# Patient Record
Sex: Male | Born: 1965 | Hispanic: Yes | Marital: Single | Smoking: Current some day smoker
Health system: Southern US, Community
[De-identification: ages and names within clinical notes are randomized; demographics above are authoritative.]

## PROBLEM LIST (undated history)

## (undated) DIAGNOSIS — K219 Gastro-esophageal reflux disease without esophagitis: Secondary | ICD-10-CM

## (undated) DIAGNOSIS — K2970 Gastritis, unspecified, without bleeding: Secondary | ICD-10-CM

## (undated) DIAGNOSIS — K5720 Diverticulitis of large intestine with perforation and abscess without bleeding: Secondary | ICD-10-CM

## (undated) DIAGNOSIS — E785 Hyperlipidemia, unspecified: Secondary | ICD-10-CM

## (undated) DIAGNOSIS — E119 Type 2 diabetes mellitus without complications: Secondary | ICD-10-CM

## (undated) DIAGNOSIS — D649 Anemia, unspecified: Secondary | ICD-10-CM

## (undated) HISTORY — PX: COLOSTOMY: SHX63

## (undated) HISTORY — DX: Hyperlipidemia, unspecified: E78.5

## (undated) HISTORY — DX: Type 2 diabetes mellitus without complications: E11.9

## (undated) HISTORY — PX: COLON SURGERY: SHX602

## (undated) HISTORY — DX: Diverticulitis of large intestine with perforation and abscess without bleeding: K57.20

---

## 2013-04-06 ENCOUNTER — Emergency Department: Payer: Self-pay | Admitting: Emergency Medicine

## 2013-04-06 LAB — BASIC METABOLIC PANEL
Anion Gap: 11 (ref 7–16)
BUN: 11 mg/dL (ref 7–18)
Calcium, Total: 8.5 mg/dL (ref 8.5–10.1)
Co2: 23 mmol/L (ref 21–32)
EGFR (African American): >60
EGFR (Non-African Amer.): >60
Osmolality: 277 (ref 275–301)
Potassium: 3.1 mmol/L — ABNORMAL LOW (ref 3.5–5.1)
Sodium: 136 mmol/L (ref 136–145)

## 2013-04-06 LAB — DRUG SCREEN, URINE
Amphetamines, Ur Screen: NEGATIVE (ref ?–1000)
Cannabinoid 50 Ng, Ur ~~LOC~~: NEGATIVE (ref ?–50)
Cocaine Metabolite,Ur ~~LOC~~: NEGATIVE (ref ?–300)
MDMA (Ecstasy)Ur Screen: NEGATIVE (ref ?–500)
Opiate, Ur Screen: NEGATIVE (ref ?–300)
Phencyclidine (PCP) Ur S: NEGATIVE (ref ?–25)
Tricyclic, Ur Screen: NEGATIVE (ref ?–1000)

## 2013-04-06 LAB — CBC
MCV: 80 fL (ref 80–100)
Platelet: 357 10*3/uL (ref 150–440)
RBC: 4.83 10*6/uL (ref 4.40–5.90)
RDW: 16.1 % — ABNORMAL HIGH (ref 11.5–14.5)
WBC: 8.4 10*3/uL (ref 3.8–10.6)

## 2013-04-07 LAB — TROPONIN I: Troponin-I: <0.02 ng/mL

## 2013-04-16 ENCOUNTER — Emergency Department: Payer: Self-pay | Admitting: Emergency Medicine

## 2013-04-16 LAB — CBC
HCT: 37.9 % — ABNORMAL LOW (ref 40.0–52.0)
HGB: 12.4 g/dL — ABNORMAL LOW (ref 13.0–18.0)
MCH: 26.6 pg (ref 26.0–34.0)
Platelet: 267 10*3/uL (ref 150–440)
RBC: 4.66 10*6/uL (ref 4.40–5.90)
RDW: 16.6 % — ABNORMAL HIGH (ref 11.5–14.5)

## 2013-04-16 LAB — COMPREHENSIVE METABOLIC PANEL
Albumin: 3.7 g/dL (ref 3.4–5.0)
Alkaline Phosphatase: 78 U/L (ref 50–136)
Anion Gap: 8 (ref 7–16)
Bilirubin,Total: 0.2 mg/dL (ref 0.2–1.0)
Calcium, Total: 8.2 mg/dL — ABNORMAL LOW (ref 8.5–10.1)
Chloride: 109 mmol/L — ABNORMAL HIGH (ref 98–107)
EGFR (Non-African Amer.): >60
Glucose: 137 mg/dL — ABNORMAL HIGH (ref 65–99)
Osmolality: 282 (ref 275–301)
Potassium: 3.5 mmol/L (ref 3.5–5.1)
SGOT(AST): 27 U/L (ref 15–37)
SGPT (ALT): 29 U/L (ref 12–78)
Sodium: 140 mmol/L (ref 136–145)

## 2013-04-16 LAB — TROPONIN I
Troponin-I: <0.02 ng/mL
Troponin-I: <0.02 ng/mL

## 2013-04-16 LAB — CK TOTAL AND CKMB (NOT AT ARMC): CK-MB: 1.8 ng/mL (ref 0.5–3.6)

## 2017-02-13 ENCOUNTER — Ambulatory Visit
Admission: RE | Admit: 2017-02-13 | Discharge: 2017-02-13 | Disposition: A | Discharge status: Home / Self Care | Payer: Self-pay | Source: Ambulatory Visit | Attending: Obstetrics and Gynecology | Admitting: Obstetrics and Gynecology

## 2017-02-13 ENCOUNTER — Other Ambulatory Visit: Payer: Self-pay | Admitting: Obstetrics and Gynecology

## 2017-02-13 DIAGNOSIS — J189 Pneumonia, unspecified organism: Secondary | ICD-10-CM | POA: Insufficient documentation

## 2017-02-13 DIAGNOSIS — F1010 Alcohol abuse, uncomplicated: Secondary | ICD-10-CM | POA: Insufficient documentation

## 2017-02-13 DIAGNOSIS — I517 Cardiomegaly: Secondary | ICD-10-CM | POA: Insufficient documentation

## 2017-02-13 DIAGNOSIS — E119 Type 2 diabetes mellitus without complications: Secondary | ICD-10-CM | POA: Insufficient documentation

## 2017-02-13 DIAGNOSIS — D649 Anemia, unspecified: Secondary | ICD-10-CM | POA: Insufficient documentation

## 2018-06-24 ENCOUNTER — Emergency Department: Payer: Self-pay | Admitting: Anesthesiology

## 2018-06-24 ENCOUNTER — Encounter: Payer: Self-pay | Admitting: Emergency Medicine

## 2018-06-24 ENCOUNTER — Emergency Department: Payer: Self-pay

## 2018-06-24 ENCOUNTER — Other Ambulatory Visit: Payer: Self-pay

## 2018-06-24 ENCOUNTER — Inpatient Hospital Stay
Admission: EM | Admit: 2018-06-24 | Discharge: 2018-06-30 | DRG: 330 | Disposition: A | Discharge status: Home / Self Care | Payer: Self-pay | Attending: Surgery | Admitting: Surgery

## 2018-06-24 ENCOUNTER — Encounter
Admission: EM | Disposition: A | Discharge status: Home / Self Care | Payer: Self-pay | Source: Home / Self Care | Attending: Surgery

## 2018-06-24 DIAGNOSIS — E785 Hyperlipidemia, unspecified: Secondary | ICD-10-CM | POA: Diagnosis present

## 2018-06-24 DIAGNOSIS — Z87891 Personal history of nicotine dependence: Secondary | ICD-10-CM

## 2018-06-24 DIAGNOSIS — N179 Acute kidney failure, unspecified: Secondary | ICD-10-CM | POA: Diagnosis present

## 2018-06-24 DIAGNOSIS — K6389 Other specified diseases of intestine: Secondary | ICD-10-CM

## 2018-06-24 DIAGNOSIS — K668 Other specified disorders of peritoneum: Secondary | ICD-10-CM

## 2018-06-24 DIAGNOSIS — E860 Dehydration: Secondary | ICD-10-CM | POA: Diagnosis present

## 2018-06-24 DIAGNOSIS — K631 Perforation of intestine (nontraumatic): Principal | ICD-10-CM | POA: Diagnosis present

## 2018-06-24 DIAGNOSIS — Z884 Allergy status to anesthetic agent status: Secondary | ICD-10-CM

## 2018-06-24 DIAGNOSIS — K56609 Unspecified intestinal obstruction, unspecified as to partial versus complete obstruction: Secondary | ICD-10-CM | POA: Diagnosis present

## 2018-06-24 DIAGNOSIS — Z8711 Personal history of peptic ulcer disease: Secondary | ICD-10-CM

## 2018-06-24 DIAGNOSIS — Z23 Encounter for immunization: Secondary | ICD-10-CM

## 2018-06-24 HISTORY — PX: LAPAROTOMY: SHX154

## 2018-06-24 LAB — CBC
HCT: 44.3 % (ref 39.0–52.0)
HEMOGLOBIN: 14.6 g/dL (ref 13.0–17.0)
MCH: 28.1 pg (ref 26.0–34.0)
MCHC: 33 g/dL (ref 30.0–36.0)
MCV: 85.4 fL (ref 80.0–100.0)
PLATELETS: 417 10*3/uL — AB (ref 150–400)
RBC: 5.19 MIL/uL (ref 4.22–5.81)
RDW: 14 % (ref 11.5–15.5)
WBC: 8.8 10*3/uL (ref 4.0–10.5)
nRBC: 0 % (ref 0.0–0.2)

## 2018-06-24 LAB — COMPREHENSIVE METABOLIC PANEL
ALBUMIN: 3.9 g/dL (ref 3.5–5.0)
ALK PHOS: 79 U/L (ref 38–126)
ALT: 44 U/L (ref 0–44)
AST: 29 U/L (ref 15–41)
Anion gap: 12 (ref 5–15)
BILIRUBIN TOTAL: 2.1 mg/dL — AB (ref 0.3–1.2)
BUN: 27 mg/dL — AB (ref 6–20)
CALCIUM: 8.8 mg/dL — AB (ref 8.9–10.3)
CO2: 27 mmol/L (ref 22–32)
CREATININE: 1.61 mg/dL — AB (ref 0.61–1.24)
Chloride: 93 mmol/L — ABNORMAL LOW (ref 98–111)
GFR calc Af Amer: 55 mL/min — ABNORMAL LOW (ref >60)
GFR calc non Af Amer: 48 mL/min — ABNORMAL LOW (ref >60)
Glucose, Bld: 339 mg/dL — ABNORMAL HIGH (ref 70–99)
POTASSIUM: 4 mmol/L (ref 3.5–5.1)
Sodium: 132 mmol/L — ABNORMAL LOW (ref 135–145)
TOTAL PROTEIN: 7.4 g/dL (ref 6.5–8.1)

## 2018-06-24 LAB — LIPASE, BLOOD: Lipase: 27 U/L (ref 11–51)

## 2018-06-24 SURGERY — LAPAROTOMY, EXPLORATORY
Anesthesia: General

## 2018-06-24 MED ORDER — FENTANYL CITRATE (PF) 100 MCG/2ML IJ SOLN
INTRAMUSCULAR | Status: AC
  Administered 2018-06-24: 25 ug via INTRAVENOUS
  Filled 2018-06-24: qty 2

## 2018-06-24 MED ORDER — SODIUM CHLORIDE 0.9 % IV BOLUS
1000.0000 mL | Freq: Once | INTRAVENOUS | Status: AC
  Administered 2018-06-24: 1000 mL via INTRAVENOUS

## 2018-06-24 MED ORDER — SUCCINYLCHOLINE CHLORIDE 20 MG/ML IJ SOLN
INTRAMUSCULAR | Status: DC | PRN
  Administered 2018-06-24: 120 mg via INTRAVENOUS

## 2018-06-24 MED ORDER — BUPIVACAINE-EPINEPHRINE (PF) 0.5% -1:200000 IJ SOLN
INTRAMUSCULAR | Status: DC | PRN
  Administered 2018-06-24: 30 mL via PERINEURAL

## 2018-06-24 MED ORDER — PANTOPRAZOLE SODIUM 40 MG IV SOLR
40.0000 mg | Freq: Every day | INTRAVENOUS | Status: DC
  Administered 2018-06-25 – 2018-06-27 (×4): 40 mg via INTRAVENOUS
  Filled 2018-06-24 (×4): qty 40

## 2018-06-24 MED ORDER — PROPOFOL 10 MG/ML IV BOLUS
INTRAVENOUS | Status: DC | PRN
  Administered 2018-06-24: 150 mg via INTRAVENOUS

## 2018-06-24 MED ORDER — LORAZEPAM 2 MG/ML IJ SOLN: 0.0000 mg | Freq: Two times a day (BID) | INTRAMUSCULAR | Status: AC

## 2018-06-24 MED ORDER — SUGAMMADEX SODIUM 200 MG/2ML IV SOLN
INTRAVENOUS | Status: DC | PRN
  Administered 2018-06-24: 150 mg via INTRAVENOUS

## 2018-06-24 MED ORDER — VITAMIN B-1 100 MG PO TABS
100.0000 mg | ORAL_TABLET | Freq: Every day | ORAL | Status: DC
  Administered 2018-06-28 – 2018-06-30 (×3): 100 mg via ORAL
  Filled 2018-06-24 (×3): qty 1

## 2018-06-24 MED ORDER — BUPIVACAINE LIPOSOME 1.3 % IJ SUSP
INTRAMUSCULAR | Status: DC | PRN
  Administered 2018-06-24: 20 mL

## 2018-06-24 MED ORDER — FENTANYL CITRATE (PF) 100 MCG/2ML IJ SOLN
INTRAMUSCULAR | Status: DC | PRN
  Administered 2018-06-24 (×5): 50 ug via INTRAVENOUS

## 2018-06-24 MED ORDER — KETAMINE HCL 50 MG/ML IJ SOLN
INTRAMUSCULAR | Status: DC | PRN
  Administered 2018-06-24: 50 mg via INTRAMUSCULAR

## 2018-06-24 MED ORDER — MIDAZOLAM HCL 2 MG/2ML IJ SOLN
INTRAMUSCULAR | Status: DC | PRN
  Administered 2018-06-24: 2 mg via INTRAVENOUS

## 2018-06-24 MED ORDER — DEXAMETHASONE SODIUM PHOSPHATE 10 MG/ML IJ SOLN
INTRAMUSCULAR | Status: DC | PRN
  Administered 2018-06-24: 10 mg via INTRAVENOUS

## 2018-06-24 MED ORDER — HEPARIN SODIUM (PORCINE) 5000 UNIT/ML IJ SOLN
5000.0000 [IU] | Freq: Three times a day (TID) | INTRAMUSCULAR | Status: DC
  Administered 2018-06-25 – 2018-06-30 (×15): 5000 [IU] via SUBCUTANEOUS
  Filled 2018-06-24 (×15): qty 1

## 2018-06-24 MED ORDER — HYDRALAZINE HCL 20 MG/ML IJ SOLN: 10.0000 mg | INTRAMUSCULAR | Status: DC | PRN

## 2018-06-24 MED ORDER — LORAZEPAM 1 MG PO TABS: 1.0000 mg | ORAL_TABLET | Freq: Four times a day (QID) | ORAL | Status: AC | PRN

## 2018-06-24 MED ORDER — KETAMINE HCL 50 MG/ML IJ SOLN
INTRAMUSCULAR | Status: AC
  Filled 2018-06-24: qty 10

## 2018-06-24 MED ORDER — HYDROMORPHONE HCL 1 MG/ML IJ SOLN
1.0000 mg | Freq: Once | INTRAMUSCULAR | Status: AC
  Administered 2018-06-24: 1 mg via INTRAVENOUS
  Filled 2018-06-24: qty 1

## 2018-06-24 MED ORDER — IOHEXOL 300 MG/ML  SOLN
75.0000 mL | Freq: Once | INTRAMUSCULAR | Status: AC | PRN
  Administered 2018-06-24: 75 mL via INTRAVENOUS

## 2018-06-24 MED ORDER — ACETAMINOPHEN 10 MG/ML IV SOLN
INTRAVENOUS | Status: DC | PRN
  Administered 2018-06-24: 1000 mg via INTRAVENOUS

## 2018-06-24 MED ORDER — FENTANYL CITRATE (PF) 250 MCG/5ML IJ SOLN
INTRAMUSCULAR | Status: AC
  Filled 2018-06-24: qty 5

## 2018-06-24 MED ORDER — ROCURONIUM BROMIDE 100 MG/10ML IV SOLN
INTRAVENOUS | Status: DC | PRN
  Administered 2018-06-24: 20 mg via INTRAVENOUS
  Administered 2018-06-24: 30 mg via INTRAVENOUS
  Administered 2018-06-24: 40 mg via INTRAVENOUS
  Administered 2018-06-24: 10 mg via INTRAVENOUS

## 2018-06-24 MED ORDER — ONDANSETRON HCL 4 MG/2ML IJ SOLN: 4.0000 mg | Freq: Once | INTRAMUSCULAR | Status: DC | PRN

## 2018-06-24 MED ORDER — PROPOFOL 10 MG/ML IV BOLUS
INTRAVENOUS | Status: AC
  Filled 2018-06-24: qty 20

## 2018-06-24 MED ORDER — LACTATED RINGERS IV SOLN
125.0000 mL/h | INTRAVENOUS | Status: DC
  Administered 2018-06-24 (×2): via INTRAVENOUS
  Administered 2018-06-25 – 2018-06-28 (×6): 125 mL/h via INTRAVENOUS

## 2018-06-24 MED ORDER — SODIUM CHLORIDE 0.9 % IV SOLN
1.0000 mg | Freq: Every day | INTRAVENOUS | Status: DC
  Administered 2018-06-25 – 2018-06-27 (×3): 1 mg via INTRAVENOUS
  Filled 2018-06-24 (×5): qty 0.2

## 2018-06-24 MED ORDER — KETOROLAC TROMETHAMINE 15 MG/ML IJ SOLN
15.0000 mg | Freq: Four times a day (QID) | INTRAMUSCULAR | Status: DC
  Administered 2018-06-25 – 2018-06-28 (×12): 15 mg via INTRAVENOUS
  Filled 2018-06-24 (×13): qty 1

## 2018-06-24 MED ORDER — PIPERACILLIN-TAZOBACTAM 3.375 G IVPB 30 MIN
3.3750 g | Freq: Once | INTRAVENOUS | Status: AC
  Administered 2018-06-24: 3.375 g via INTRAVENOUS
  Filled 2018-06-24: qty 50

## 2018-06-24 MED ORDER — MIDAZOLAM HCL 2 MG/2ML IJ SOLN
INTRAMUSCULAR | Status: AC
  Filled 2018-06-24: qty 2

## 2018-06-24 MED ORDER — ACETAMINOPHEN 10 MG/ML IV SOLN
INTRAVENOUS | Status: AC
  Filled 2018-06-24: qty 100

## 2018-06-24 MED ORDER — LORAZEPAM 2 MG/ML IJ SOLN: 0.0000 mg | Freq: Four times a day (QID) | INTRAMUSCULAR | Status: AC

## 2018-06-24 MED ORDER — ONDANSETRON HCL 4 MG/2ML IJ SOLN
4.0000 mg | Freq: Four times a day (QID) | INTRAMUSCULAR | Status: DC | PRN
  Administered 2018-06-26: 4 mg via INTRAVENOUS
  Filled 2018-06-24: qty 2

## 2018-06-24 MED ORDER — ONDANSETRON HCL 4 MG/2ML IJ SOLN
4.0000 mg | Freq: Once | INTRAMUSCULAR | Status: AC
  Administered 2018-06-24: 4 mg via INTRAVENOUS
  Filled 2018-06-24: qty 2

## 2018-06-24 MED ORDER — PHENYLEPHRINE HCL 10 MG/ML IJ SOLN
INTRAMUSCULAR | Status: DC | PRN
  Administered 2018-06-24 (×3): 200 ug via INTRAVENOUS
  Administered 2018-06-24 (×2): 100 ug via INTRAVENOUS

## 2018-06-24 MED ORDER — INFLUENZA VAC SPLIT QUAD 0.5 ML IM SUSY
0.5000 mL | PREFILLED_SYRINGE | INTRAMUSCULAR | Status: AC
  Administered 2018-06-28: 0.5 mL via INTRAMUSCULAR
  Filled 2018-06-24: qty 0.5

## 2018-06-24 MED ORDER — THIAMINE HCL 100 MG/ML IJ SOLN
100.0000 mg | Freq: Every day | INTRAMUSCULAR | Status: DC
  Administered 2018-06-25 – 2018-06-27 (×3): 100 mg via INTRAVENOUS
  Filled 2018-06-24 (×3): qty 2

## 2018-06-24 MED ORDER — HYDROMORPHONE HCL 1 MG/ML IJ SOLN
0.5000 mg | INTRAMUSCULAR | Status: DC | PRN
  Administered 2018-06-26 (×2): 0.5 mg via INTRAVENOUS
  Filled 2018-06-24 (×2): qty 0.5

## 2018-06-24 MED ORDER — ONDANSETRON 4 MG PO TBDP: 4.0000 mg | ORAL_TABLET | Freq: Four times a day (QID) | ORAL | Status: DC | PRN

## 2018-06-24 MED ORDER — LIDOCAINE HCL (CARDIAC) PF 100 MG/5ML IV SOSY
PREFILLED_SYRINGE | INTRAVENOUS | Status: DC | PRN
  Administered 2018-06-24: 100 mg via INTRAVENOUS

## 2018-06-24 MED ORDER — FENTANYL CITRATE (PF) 100 MCG/2ML IJ SOLN
25.0000 ug | INTRAMUSCULAR | Status: DC | PRN
  Administered 2018-06-24 (×4): 25 ug via INTRAVENOUS

## 2018-06-24 MED ORDER — PIPERACILLIN-TAZOBACTAM 3.375 G IVPB
3.3750 g | Freq: Three times a day (TID) | INTRAVENOUS | Status: DC
  Administered 2018-06-25 – 2018-06-30 (×16): 3.375 g via INTRAVENOUS
  Filled 2018-06-24 (×16): qty 50

## 2018-06-24 MED ORDER — LORAZEPAM 2 MG/ML IJ SOLN: 1.0000 mg | Freq: Four times a day (QID) | INTRAMUSCULAR | Status: AC | PRN

## 2018-06-24 MED ORDER — LACTATED RINGERS IV SOLN
INTRAVENOUS | Status: DC | PRN
  Administered 2018-06-24 (×2): via INTRAVENOUS

## 2018-06-24 MED ORDER — ONDANSETRON HCL 4 MG/2ML IJ SOLN
INTRAMUSCULAR | Status: DC | PRN
  Administered 2018-06-24: 4 mg via INTRAVENOUS

## 2018-06-24 SURGICAL SUPPLY — 51 items
BULB RESERV EVAC DRAIN JP 100C (MISCELLANEOUS) ×9 IMPLANT
CANISTER SUCT 1200ML W/VALVE (MISCELLANEOUS) ×3 IMPLANT
CATH URET ROBINSON 16FR STRL (CATHETERS) ×3 IMPLANT
CHLORAPREP W/TINT 10.5 ML (MISCELLANEOUS) ×3 IMPLANT
COVER WAND RF STERILE (DRAPES) ×3 IMPLANT
DECANTER SPIKE VIAL GLASS SM (MISCELLANEOUS) ×3 IMPLANT
DRAIN CHANNEL JP 19F (MISCELLANEOUS) ×6 IMPLANT
DRAIN PENROSE 1/4X12 LTX (DRAIN) ×3 IMPLANT
DRAPE LAPAROTOMY 100X77 ABD (DRAPES) ×3 IMPLANT
DRSG OPSITE POSTOP 4X12 (GAUZE/BANDAGES/DRESSINGS) ×3 IMPLANT
DRSG TEGADERM 4X10 (GAUZE/BANDAGES/DRESSINGS) ×3 IMPLANT
DRSG TEGADERM 4X4.75 (GAUZE/BANDAGES/DRESSINGS) ×6 IMPLANT
ELECT CAUTERY BLADE 6.4 (BLADE) ×3 IMPLANT
ELECT REM PT RETURN 9FT ADLT (ELECTROSURGICAL) ×3
ELECTRODE REM PT RTRN 9FT ADLT (ELECTROSURGICAL) ×1 IMPLANT
GAUZE SPONGE 4X4 12PLY STRL (GAUZE/BANDAGES/DRESSINGS) ×3 IMPLANT
GLOVE SURG SYN 7.0 (GLOVE) ×6 IMPLANT
GLOVE SURG SYN 7.5  E (GLOVE) ×4
GLOVE SURG SYN 7.5 E (GLOVE) ×2 IMPLANT
GOWN STRL REUS W/ TWL LRG LVL3 (GOWN DISPOSABLE) ×4 IMPLANT
GOWN STRL REUS W/TWL LRG LVL3 (GOWN DISPOSABLE) ×8
KIT OSTOMY 2 PC DRNBL 2.25 STR (WOUND CARE) ×1 IMPLANT
KIT OSTOMY DRAINABLE 2.25 STR (WOUND CARE) ×2
KIT OSTOMY DRAINABLE 2.75 STR (WOUND CARE) ×3 IMPLANT
LABEL OR SOLS (LABEL) ×3 IMPLANT
LIGASURE IMPACT 36 18CM CVD LR (INSTRUMENTS) ×3 IMPLANT
NEEDLE HYPO 22GX1.5 SAFETY (NEEDLE) ×3 IMPLANT
NS IRRIG 1000ML POUR BTL (IV SOLUTION) ×3 IMPLANT
PACK BASIN MAJOR ARMC (MISCELLANEOUS) ×3 IMPLANT
PACK COLON CLEAN CLOSURE (MISCELLANEOUS) IMPLANT
RELOAD PROXIMATE 75MM BLUE (ENDOMECHANICALS) ×3 IMPLANT
SEPRAFILM MEMBRANE 5X6 (MISCELLANEOUS) IMPLANT
SPONGE LAP 18X18 RF (DISPOSABLE) ×3 IMPLANT
STAPLER CUT CVD 40MM GREEN (STAPLE) ×3 IMPLANT
STAPLER PROXIMATE 75MM BLUE (STAPLE) ×3 IMPLANT
STAPLER SKIN PROX 35W (STAPLE) ×3 IMPLANT
SUT ETHILON 3-0 FS-10 30 BLK (SUTURE) ×9
SUT PDS AB 1 CT1 36 (SUTURE) ×6 IMPLANT
SUT PDS AB 1 TP1 96 (SUTURE) ×6 IMPLANT
SUT PROLENE 0 CT 1 30 (SUTURE) IMPLANT
SUT PROLENE 2 0 SH DA (SUTURE) ×3 IMPLANT
SUT SILK 2 0 (SUTURE) ×2
SUT SILK 2-0 18XBRD TIE 12 (SUTURE) ×1 IMPLANT
SUT SILK 3-0 (SUTURE) ×3 IMPLANT
SUT VIC AB 3-0 SH 27 (SUTURE) ×2
SUT VIC AB 3-0 SH 27X BRD (SUTURE) ×1 IMPLANT
SUT VICRYL 3-0 CR8 SH (SUTURE) ×3 IMPLANT
SUT VICRYL 3-0 SH-1 18IN (SUTURE) ×3 IMPLANT
SUTURE EHLN 3-0 FS-10 30 BLK (SUTURE) ×3 IMPLANT
SYR 10ML LL (SYRINGE) ×3 IMPLANT
TRAY FOLEY MTR SLVR 16FR STAT (SET/KITS/TRAYS/PACK) ×3 IMPLANT

## 2018-06-24 NOTE — ED Provider Notes (Signed)
[facility] Emergency Department Provider Note  ____________________________________________  Time seen: Approximately 3:51 PM  I have reviewed the triage vital signs and the nursing notes.   HISTORY  Chief Complaint Abdominal pain  Encounter completed with video interpreter at bedside.  HPI Alexander Carpenter is a 52 y.o. male with past medical history of peptic ulcer disease who complains of gradual onset of worsening abdominal pain over the past 8 days, constant.  Generalized.  Nonradiating.  Worse with any movement.  No alleviating factors.  Associated with vomiting and diarrhea.  He reports that his stool has been black and that he had been seeing blood in it but for the last 2 days he thinks there is not been blood.  He reports that he used to drink 6-8 alcoholic beverages a day, beer or tequila, but has not had anything to drink in 8 days due to his symptoms.  No aspirin or blood thinner use.  Last ate yesterday afternoon, consisting of a banana   Past medical history Peptic ulcer disease  There are no active problems to display for this patient.    History reviewed. No pertinent surgical history.   Prior to Admission medications   Not on File  None   Allergies Anesthesia s-i-40 [propofol]   No family history on file.  Social History Social History   Tobacco Use  . Smoking status: Not on file  Substance Use Topics  . Alcohol use: Not on file  . Drug use: Not on file  No tobacco use.  Formerly heavy daily alcohol, none in the last 8 days  Review of Systems  Constitutional:   No fever or chills.  ENT:   No sore throat. No rhinorrhea. Cardiovascular:   No chest pain or syncope. Respiratory:   No dyspnea or cough. Gastrointestinal: Positive as above for abdominal pain vomiting and diarrhea Musculoskeletal:   Negative for focal pain or swelling All other systems reviewed and are negative except as documented above in ROS and  HPI.  ____________________________________________   PHYSICAL EXAM:  VITAL SIGNS: ED Triage Vitals  Enc Vitals Group     BP 06/24/18 1101 (!) 118/93     Pulse Rate 06/24/18 1101 (!) 113     Resp 06/24/18 1101 20     Temp 06/24/18 1101 98.2 F (36.8 C)     Temp Source 06/24/18 1101 Oral     SpO2 06/24/18 1101 96 %     Weight 06/24/18 1102 164 lb (74.4 kg)     Height 06/24/18 1102 5\' 4"  (1.626 m)     Head Circumference --      Peak Flow --      Pain Score 06/24/18 1102 10     Pain Loc --      Pain Edu? --      Excl. in GC? --     Vital signs reviewed, nursing assessments reviewed.   Constitutional:   Alert and oriented. Non-toxic appearance. Eyes:   Conjunctivae are normal. EOMI. PERRL. ENT      Head:   Normocephalic and atraumatic.      Nose:   No congestion/rhinnorhea.       Mouth/Throat:   Dry mucous membranes, no pharyngeal erythema. No peritonsillar mass.       Neck:   No meningismus. Full ROM. Hematological/Lymphatic/Immunilogical:   No cervical lymphadenopathy. Cardiovascular:   RRR. Symmetric bilateral radial and DP pulses.  No murmurs. Cap refill less than 2 seconds. Respiratory:   Normal  respiratory effort without tachypnea/retractions. Breath sounds are clear and equal bilaterally. No wheezes/rales/rhonchi. Gastrointestinal:   Abdomen is distended and tympanitic.  Diffusely tender with guarding and rebound tenderness.  Nonrigid.  There is a small umbilical hernia, not incarcerated.  Rectal exam reveals brown stool, Hemoccult positive  Musculoskeletal:   Normal range of motion in all extremities. No joint effusions.  No lower extremity tenderness.  No edema. Neurologic:   Normal speech and language.  Motor grossly intact. No acute focal neurologic deficits are appreciated.  Skin:    Skin is warm, dry and intact. No rash noted.  No petechiae, purpura, or bullae.  ____________________________________________    LABS (pertinent positives/negatives) (all labs  ordered are listed, but only abnormal results are displayed) Labs Reviewed  COMPREHENSIVE METABOLIC PANEL - Abnormal; Notable for the following components:      Result Value   Sodium 132 (*)    Chloride 93 (*)    Glucose, Bld 339 (*)    BUN 27 (*)    Creatinine, Ser 1.61 (*)    Calcium 8.8 (*)    Total Bilirubin 2.1 (*)    GFR calc non Af Amer 48 (*)    GFR calc Af Amer 55 (*)    All other components within normal limits  CBC - Abnormal; Notable for the following components:   Platelets 417 (*)    All other components within normal limits  LIPASE, BLOOD  URINALYSIS, COMPLETE (UACMP) WITH MICROSCOPIC   ____________________________________________   EKG    ____________________________________________    RADIOLOGY  Ct Abdomen Pelvis W Contrast  Result Date: 06/24/2018 CLINICAL DATA:  Generalized abdominal pain. Diarrhea and vomiting. Personal history of stomach ulcer. EXAM: CT ABDOMEN AND PELVIS WITH CONTRAST TECHNIQUE: Multidetector CT imaging of the abdomen and pelvis was performed using the standard protocol following bolus administration of intravenous contrast. CONTRAST:  75mL OMNIPAQUE IOHEXOL 300 MG/ML  SOLN COMPARISON:  None. FINDINGS: Lower chest: The lung bases demonstrate mild dependent atelectasis, right greater than left. Heart size is upper limits of normal. No significant pericardial effusion is present. Hepatobiliary: No focal liver abnormality is seen. No gallstones, gallbladder wall thickening, or biliary dilatation. There is some fluid about the free edge of the liver. Pancreas: Unremarkable. No pancreatic ductal dilatation or surrounding inflammatory changes. Spleen: Mild fluid is present about the spleen no discrete splenic lesions are present. Adrenals/Urinary Tract: The adrenal glands are normal bilaterally. Kidneys and ureters are within normal limits. The urinary bladder is unremarkable. Stomach/Bowel: Stomach duodenum are within normal limits. There is gas  throughout the small and large bowel mesentery. Small bowel is otherwise unremarkable. The cecum is dilated. Extensive pneumatosis is present in the cecum. Hepatic flexure and transverse colon are within normal limits. Proximal descending colon is unremarkable. There is marked wall thickening and narrowing of the proximal sigmoid, concerning for neoplasm. Vascular/Lymphatic: No significant adenopathy is present. No vascular lesions are present. Reproductive: Prostate is unremarkable. Other: Scattered free fluid is present. There is also gas along the sigmoid colon. No abscess formation is present. Musculoskeletal: Vertebral body heights alignment are maintained. Moderate degenerative changes and disc disease is present at L4-5. There is central canal and foraminal narrowing at L4-5 greater than L3-4. Bony pelvis is within normal limits. No focal lytic or blastic lesions are present. IMPRESSION: 1. Pneumoperitoneum. 2. 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 3. Pneumatosis in the cecum. This may reflect  the sequelae of obstruction secondary to the sigmoid mass. 4. Free fluid is reactive. 5. No evidence for metastatic disease to lymph nodes or liver. Critical Value/emergent results were called by telephone at the time of interpretation on 06/24/2018 at 4:30 pm to Dr. Sharman Cheek , who verbally acknowledged these results. Electronically Signed   By: Marin Roberts M.D.   On: 06/24/2018 16:33    ____________________________________________   PROCEDURES .Critical Care Performed by: Sharman Cheek, MD Authorized by: Sharman Cheek, MD   Critical care provider statement:    Critical care time (minutes):  30   Critical care time was exclusive of:  Separately billable procedures and treating other patients   Critical care was necessary to treat or prevent imminent or  life-threatening deterioration of the following conditions:  Shock and sepsis   Critical care was time spent personally by me on the following activities:  Development of treatment plan with patient or surrogate, discussions with consultants, evaluation of patient's response to treatment, examination of patient, obtaining history from patient or surrogate, ordering and performing treatments and interventions, ordering and review of laboratory studies, ordering and review of radiographic studies, pulse oximetry, re-evaluation of patient's condition and review of old charts    ____________________________________________  DIFFERENTIAL DIAGNOSIS   Bowel perforation, bowel obstruction, cirrhosis with ascites and SBP, biliary disease, GI bleed  CLINICAL IMPRESSION / ASSESSMENT AND PLAN / ED COURSE  Pertinent labs & imaging results that were available during my care of the patient were reviewed by me and considered in my medical decision making (see chart for details).    Patient is nontoxic, mildly tachycardic and dehydrated, presents with worsening abdominal pain and concerning exam.  I will obtain a CT scan to evaluate for surgical pathology.  Anticipate patient will need to be hospitalized given his impressive symptoms and exam and finding of occult GI bleeding on exam.  He also has acute kidney injury on labs although not acutely anemic.  Although he does not have an acute abdomen at this time, suspicion for peritonitis and impending sepsis is high enough that I am giving him Zosyn now, without waiting for imaging results.  IV fluids for dehydration, Dilaudid 1 mg IV for pain control and Zofran 4 mg IV for nausea.  Clinical Course as of Jun 24 1700  Thu Jun 24, 2018  1618 My review of CT images does reveal large pneumoperitoneum.  Surgery paged.   [PS]  1625 Discussed with radiology and general surgery.  Radiology confirms pneumoperitoneum, high suspicion for focus of disease in the cecum.   General surgery will plan for emergent exploratory laparotomy.   [PS]  1633 Received additional callback from radiology Dr. Alfredo Batty after further review of imaging.  He reports that the sigmoid colon is suspicious for malignancy that caused obstruction and eventual dilation, pneumatosis, necrosis of the cecum resulting in rupture and pneumoperitoneum.   [PS]    Clinical Course User Index [PS] Sharman Cheek, MD     ----------------------------------------- 5:01 PM on 06/24/2018 -----------------------------------------  Surgery was to the bedside within minutes of my follow-up call with radiology.  I updated Dr. Aleen Campi on the additional radiology findings.  Awaiting OR.  ____________________________________________   FINAL CLINICAL IMPRESSION(S) / ED DIAGNOSES    Final diagnoses:  AKI (acute kidney injury) (HCC)  Pneumoperitoneum  Colonic mass     ED Discharge Orders    None      Portions of this note were generated with dragon dictation software. Dictation errors may occur  despite best attempts at proofreading.    Sharman CheekStafford, Keijuan Schellhase, MD 06/24/18 [phone]

## 2018-06-24 NOTE — ED Notes (Signed)
Request for Interpreter sent

## 2018-06-24 NOTE — Transfer of Care (Signed)
Immediate Anesthesia Transfer of Care Note  Patient: Alexander Carpenter  Procedure(s) Performed: EXPLORATORY LAPAROTOMY (N/A )  Patient Location: PACU  Anesthesia Type:General  Level of Consciousness: sedated  Airway & Oxygen Therapy: Patient connected to face mask oxygen  Post-op Assessment: Post -op Vital signs reviewed and stable  Post vital signs: stable  Last Vitals:  Vitals Value Taken Time  BP 118/85 06/24/2018 10:05 PM  Temp 36.8 C 06/24/2018 10:05 PM  Pulse 83 06/24/2018 10:06 PM  Resp 18 06/24/2018 10:06 PM  SpO2 100 % 06/24/2018 10:06 PM  Vitals shown include unvalidated device data.  Last Pain:  Vitals:   06/24/18 2205  TempSrc: Temporal  PainSc:          Complications: No apparent anesthesia complications

## 2018-06-24 NOTE — ED Triage Notes (Signed)
Per interpreter, Pt reports for the past 8 days he has had abd pain. Pt reports diarrhea and vomitting or dry heaves. Pt reports hx of stomach ulcer and thinks it is happening again.

## 2018-06-24 NOTE — H&P (Signed)
Date of Admission:  06/24/2018  Reason for Admission:  Bowel perforation  History of Present Illness: Alexander Carpenter is a 52 y.o. male presenting with a one week history of worsening abdominal pain.  This started initially as some mild pain in the left upper quadrant and now is diffuse.  The pain last night was the worst and he presented today to the ER.  Workup revealed a normal WBC, with dehydration and elevated Cr, and CT scan showed massive amount of free air, with concerning source being the cecum, and a possible sigmoid mass.  He denies any fevers or chills at home.  Reports having nausea and dry heaves.    Past Medical History: --cholesterol issues in the past   Past Surgical History: --Dental extractions  Home Medications: Prior to Admission medications   None    Allergies: Allergies  Allergen Reactions  . Anesthesia S-I-40 [Propofol] Other (See Comments)    Blurry vision and cold sweats    Social History: Reports he used to smoke but quit many years ago.  Reports that he drinks at least a 6 pack per day, and sometimes tequila as well.  Denies any drug use.   Family History: -no cancer history  Review of Systems: Review of Systems  Constitutional: Negative for chills and fever.  HENT: Negative for hearing loss.   Eyes: Negative for blurred vision.  Respiratory: Negative for shortness of breath.   Cardiovascular: Negative for chest pain.  Gastrointestinal: Positive for abdominal pain, blood in stool, nausea and vomiting. Negative for constipation and diarrhea.  Genitourinary: Negative for dysuria.  Musculoskeletal: Negative for myalgias.  Skin: Negative for rash.  Neurological: Negative for dizziness.  Psychiatric/Behavioral: Negative for depression.    Physical Exam BP (!) 135/99   Pulse (!) 103   Temp 98.2 F (36.8 C) (Oral)   Resp 20   Ht 5\' 4"  (1.626 m)   Wt 74.4 kg   SpO2 95%   BMI 28.15 kg/m  CONSTITUTIONAL: No acute distress HEENT:   Normocephalic, atraumatic, extraocular motion intact. NECK: Trachea is midline, and there is no jugular venous distension.  RESPIRATORY:  Lungs are clear, and breath sounds are equal bilaterally. Normal respiratory effort without pathologic use of accessory muscles. CARDIOVASCULAR: Heart is regular without murmurs, gallops, or rubs. GI: The abdomen is soft, very distended, with diffuse tenderness, peritoneal.  MUSCULOSKELETAL:  Normal muscle strength and tone in all four extremities.  No peripheral edema or cyanosis. SKIN: Skin turgor is normal. There are no pathologic skin lesions.  NEUROLOGIC:  Motor and sensation is grossly normal.  Cranial nerves are grossly intact. PSYCH:  Alert and oriented to person, place and time. Affect is normal.  Laboratory Analysis: Results for orders placed or performed during the hospital encounter of 06/24/18 (from the past 24 hour(s))  Lipase, blood     Status: None   Collection Time: 06/24/18 11:17 AM  Result Value Ref Range   Lipase 27 11 - 51 U/L  Comprehensive metabolic panel     Status: Abnormal   Collection Time: 06/24/18 11:17 AM  Result Value Ref Range   Sodium 132 (L) 135 - 145 mmol/L   Potassium 4.0 3.5 - 5.1 mmol/L   Chloride 93 (L) 98 - 111 mmol/L   CO2 27 22 - 32 mmol/L   Glucose, Bld 339 (H) 70 - 99 mg/dL   BUN 27 (H) 6 - 20 mg/dL   Creatinine, Ser 9.601.61 (H) 0.61 - 1.24 mg/dL   Calcium 8.8 (L)  8.9 - 10.3 mg/dL   Total Protein 7.4 6.5 - 8.1 g/dL   Albumin 3.9 3.5 - 5.0 g/dL   AST 29 15 - 41 U/L   ALT 44 0 - 44 U/L   Alkaline Phosphatase 79 38 - 126 U/L   Total Bilirubin 2.1 (H) 0.3 - 1.2 mg/dL   GFR calc non Af Amer 48 (L) >60 mL/min   GFR calc Af Amer 55 (L) >60 mL/min   Anion gap 12 5 - 15  CBC     Status: Abnormal   Collection Time: 06/24/18 11:17 AM  Result Value Ref Range   WBC 8.8 4.0 - 10.5 K/uL   RBC 5.19 4.22 - 5.81 MIL/uL   Hemoglobin 14.6 13.0 - 17.0 g/dL   HCT 16.1 09.6 - 04.5 %   MCV 85.4 80.0 - 100.0 fL   MCH 28.1  26.0 - 34.0 pg   MCHC 33.0 30.0 - 36.0 g/dL   RDW 40.9 81.1 - 91.4 %   Platelets 417 (H) 150 - 400 K/uL   nRBC 0.0 0.0 - 0.2 %    Imaging: Ct Abdomen Pelvis W Contrast  Result Date: 06/24/2018 CLINICAL DATA:  Generalized abdominal pain. Diarrhea and vomiting. Personal history of stomach ulcer. EXAM: CT ABDOMEN AND PELVIS WITH CONTRAST TECHNIQUE: Multidetector CT imaging of the abdomen and pelvis was performed using the standard protocol following bolus administration of intravenous contrast. CONTRAST:  75mL OMNIPAQUE IOHEXOL 300 MG/ML  SOLN COMPARISON:  None. FINDINGS: Lower chest: The lung bases demonstrate mild dependent atelectasis, right greater than left. Heart size is upper limits of normal. No significant pericardial effusion is present. Hepatobiliary: No focal liver abnormality is seen. No gallstones, gallbladder wall thickening, or biliary dilatation. There is some fluid about the free edge of the liver. Pancreas: Unremarkable. No pancreatic ductal dilatation or surrounding inflammatory changes. Spleen: Mild fluid is present about the spleen no discrete splenic lesions are present. Adrenals/Urinary Tract: The adrenal glands are normal bilaterally. Kidneys and ureters are within normal limits. The urinary bladder is unremarkable. Stomach/Bowel: Stomach duodenum are within normal limits. There is gas throughout the small and large bowel mesentery. Small bowel is otherwise unremarkable. The cecum is dilated. Extensive pneumatosis is present in the cecum. Hepatic flexure and transverse colon are within normal limits. Proximal descending colon is unremarkable. There is marked wall thickening and narrowing of the proximal sigmoid, concerning for neoplasm. Vascular/Lymphatic: No significant adenopathy is present. No vascular lesions are present. Reproductive: Prostate is unremarkable. Other: Scattered free fluid is present. There is also gas along the sigmoid colon. No abscess formation is present.  Musculoskeletal: Vertebral body heights alignment are maintained. Moderate degenerative changes and disc disease is present at L4-5. There is central canal and foraminal narrowing at L4-5 greater than L3-4. Bony pelvis is within normal limits. No focal lytic or blastic lesions are present. IMPRESSION: 1. Pneumoperitoneum. 2. 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 3. Pneumatosis in the cecum. This may reflect the sequelae of obstruction secondary to the sigmoid mass. 4. Free fluid is reactive. 5. No evidence for metastatic disease to lymph nodes or liver. Critical Value/emergent results were called by telephone at the time of interpretation on 06/24/2018 at 4:30 pm to Dr. Sharman Cheek , who verbally acknowledged these results. Electronically Signed   By: Marin Roberts M.D.   On: 06/24/2018 16:33    Assessment and Plan: This is a 52  y.o. male with bowel perforation and free air.  I have independently viewed the patient's imaging study and reviewed his laboratory studies.  The patient's CT scan does indeed show massive amount of free air throughout the abdomen, with a distended cecum with pneumatosis and likely the perforation source.  His sigmoid is indeed thickened, and cannot distinguish if there is a mass, though there is no fat stranding or diverticular disease in that area.  Discussed with the patient that he needs to go to the OR for exploratory laparotomy, bowel resection, and ostomy.  Discussed the risks of bleeding, infection, and injury to surrounding structures.  He's hesitant given the shock of the news as this was completely unexpected to him.  However, he understands that if nothing is done, he may die.  At this point he is not toxic but does need emergent surgery.  He's willing to proceed.   Howie Ill, MD [facility]:  [phone]

## 2018-06-24 NOTE — Anesthesia Postprocedure Evaluation (Signed)
Anesthesia Post Note  Patient: Chief Financial OfficerMargarito Guzman Carpenter  Procedure(s) Performed: EXPLORATORY LAPAROTOMY (N/A )  Patient location during evaluation: PACU Anesthesia Type: General Level of consciousness: awake and alert Pain management: pain level controlled Vital Signs Assessment: post-procedure vital signs reviewed and stable Respiratory status: spontaneous breathing, nonlabored ventilation, respiratory function stable and patient connected to nasal cannula oxygen Cardiovascular status: blood pressure returned to baseline and stable Postop Assessment: no apparent nausea or vomiting Anesthetic complications: no     Last Vitals:  Vitals:   06/24/18 2240 06/24/18 2245  BP:    Pulse: 95 95  Resp: 12 11  Temp:    SpO2: 96% 94%    Last Pain:  Vitals:   06/24/18 2245  TempSrc:   PainSc: 5                  Yevette EdwardsJames G Laruen Risser

## 2018-06-24 NOTE — ED Notes (Signed)
MD at bedside, interpreter on wheels

## 2018-06-24 NOTE — Anesthesia Post-op Follow-up Note (Signed)
Anesthesia QCDR form completed.        

## 2018-06-24 NOTE — ED Notes (Signed)
Pt leaving unit for surgery. Consent and stickers sent with him.

## 2018-06-24 NOTE — Anesthesia Preprocedure Evaluation (Signed)
Anesthesia Evaluation  Patient identified by MRN, date of birth, ID band Patient awake    Reviewed: Allergy & Precautions, H&P , NPO status , Patient's Chart, lab work & pertinent test results, reviewed documented beta blocker date and time   Airway Mallampati: II  TM Distance: >3 FB Neck ROM: full    Dental  (+) Teeth Intact   Pulmonary neg pulmonary ROS,    Pulmonary exam normal        Cardiovascular Exercise Tolerance: Good negative cardio ROS Normal cardiovascular exam Rhythm:regular Rate:Normal     Neuro/Psych negative neurological ROS  negative psych ROS   GI/Hepatic negative GI ROS, Neg liver ROS,   Endo/Other  negative endocrine ROS  Renal/GU negative Renal ROS  negative genitourinary   Musculoskeletal   Abdominal   Peds  Hematology negative hematology ROS (+)   Anesthesia Other Findings History reviewed. No pertinent past medical history. History reviewed. No pertinent surgical history. BMI    Body Mass Index:  28.15 kg/m     Reproductive/Obstetrics negative OB ROS                             Anesthesia Physical Anesthesia Plan  ASA: II and emergent  Anesthesia Plan: General ETT   Post-op Pain Management:    Induction:   PONV Risk Score and Plan:   Airway Management Planned:   Additional Equipment:   Intra-op Plan:   Post-operative Plan:   Informed Consent: I have reviewed the patients History and Physical, chart, labs and discussed the procedure including the risks, benefits and alternatives for the proposed anesthesia with the patient or authorized representative who has indicated his/her understanding and acceptance.   Dental Advisory Given  Plan Discussed with: CRNA  Anesthesia Plan Comments:         Anesthesia Quick Evaluation

## 2018-06-24 NOTE — Anesthesia Procedure Notes (Signed)
Procedure Name: Intubation Date/Time: 06/24/2018 5:53 PM Performed by: Irving BurtonBachich, Khiyan Crace, CRNA Pre-anesthesia Checklist: Patient identified, Emergency Drugs available, Suction available and Patient being monitored Patient Re-evaluated:Patient Re-evaluated prior to induction Oxygen Delivery Method: Circle system utilized Preoxygenation: Pre-oxygenation with 100% oxygen Induction Type: IV induction, Rapid sequence and Cricoid Pressure applied Laryngoscope Size: McGraph and 3 Grade View: Grade I Tube type: Oral Tube size: 7.5 mm Number of attempts: 1 Airway Equipment and Method: Stylet and Video-laryngoscopy Placement Confirmation: ETT inserted through vocal cords under direct vision,  positive ETCO2 and breath sounds checked- equal and bilateral Secured at: 22 cm Tube secured with: Tape Dental Injury: Teeth and Oropharynx as per pre-operative assessment

## 2018-06-24 NOTE — ED Notes (Signed)
Report given to Georgie RN 

## 2018-06-24 NOTE — ED Notes (Signed)
Pt leaving for CT.  

## 2018-06-24 NOTE — ED Notes (Signed)
Report given to OR RN.

## 2018-06-25 ENCOUNTER — Encounter: Payer: Self-pay | Admitting: *Deleted

## 2018-06-25 LAB — BASIC METABOLIC PANEL
Anion gap: 6 (ref 5–15)
BUN: 30 mg/dL — AB (ref 6–20)
CALCIUM: 7.7 mg/dL — AB (ref 8.9–10.3)
CO2: 27 mmol/L (ref 22–32)
CREATININE: 1.52 mg/dL — AB (ref 0.61–1.24)
Chloride: 104 mmol/L (ref 98–111)
GFR calc non Af Amer: 51 mL/min — ABNORMAL LOW (ref >60)
GFR, EST AFRICAN AMERICAN: 59 mL/min — AB (ref >60)
Glucose, Bld: 215 mg/dL — ABNORMAL HIGH (ref 70–99)
Potassium: 4.4 mmol/L (ref 3.5–5.1)
SODIUM: 137 mmol/L (ref 135–145)

## 2018-06-25 LAB — MAGNESIUM: MAGNESIUM: 2.1 mg/dL (ref 1.7–2.4)

## 2018-06-25 LAB — CBC
HEMATOCRIT: 38.6 % — AB (ref 39.0–52.0)
Hemoglobin: 12.7 g/dL — ABNORMAL LOW (ref 13.0–17.0)
MCH: 28.5 pg (ref 26.0–34.0)
MCHC: 32.9 g/dL (ref 30.0–36.0)
MCV: 86.7 fL (ref 80.0–100.0)
Platelets: 274 10*3/uL (ref 150–400)
RBC: 4.45 MIL/uL (ref 4.22–5.81)
RDW: 14.4 % (ref 11.5–15.5)
WBC: 4.1 10*3/uL (ref 4.0–10.5)
nRBC: 0 % (ref 0.0–0.2)

## 2018-06-25 NOTE — Consult Note (Signed)
WOC Nurse ostomy follow up [facility]  No family present.  Patient very sleepy during encounter.  This is reasonable as his surgery was completed late last evening. Stoma type/location: RLQ ileostomy viewed through the surgically placed pouch. Pink, moist, budded, non-productive. LLQ colostomy viewed through the surgically placed pouch.  Pink, moist, budded, producing large amounts of pudding consistency stool.  175 ml emptied from the pouch. Both pouches intact without any signs of leakage.  Additional 2 piece, 2 3/4 inch pouching systems, and barrier rings obtained and placed at the bedside. The patient will need interpretive services for ostomy teaching as he speaks little [place]. Stomal assessment/size: Deferred Peristomal assessment: Deferred Treatment options for stomal/peristomal skin: Barrier rings Output: None for ileostomy; large amount of pudding consistency brown from colostomy Ostomy pouching: 2pc.  Education provided: None, patient too sleepy Enrolled patient in [place] [employer] Discharge program: No  Helmut MusterSherry Emilianna Barlowe, RN, MSN, [employer], CNS-BC, pager [phone]

## 2018-06-25 NOTE — Progress Notes (Signed)
Inpatient Diabetes Program Recommendations  AACE/ADA: New Consensus Statement on Inpatient Glycemic Control (2015)  Target Ranges:  Prepandial:   less than 140 mg/dL      Peak postprandial:   less than 180 mg/dL (1-2 hours)      Critically ill patients:  140 - 180 mg/dL   Results for Alexander Carpenter, [name] (MRN [id number]) as of 06/25/2018 10:06  Ref. 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  Patient received 10 mg Decadron X 1 dose yesterday at 8:43pm.  Lab glucose elevated yesterday AM on admission and again this AM.      MD- Please consider the following:  1. Start Novolog Sensitive Correction Scale/ SSI (0-9 units) Q4 hours  2. May want to check Hemoglobin A1c level to check to see if patient has undiagnosed diabetes given elevated glucose levels on admission     --Will follow patient during hospitalization--  Ambrose FinlandJeannine Johnston Zoeann Mol RN, MSN, CDE Diabetes Coordinator Inpatient Glycemic Control Team Team Pager: [phone] (8a-5p)

## 2018-06-25 NOTE — Op Note (Addendum)
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  Surgeon:  Howie Ill, MD  Assistant:  Hulda Marin, MD.  His assistance was of critical importance due to the complexity of the case with resection of two segments of colon, and creation of multiple ostomies.    Anesthesia:  General endotracheal  Estimated Blood Loss:  100 ml  Specimens:  Right colon, sigmoid colon, omentum  Complications:  None  Findings:  Necrotic cecum with two areas of perforation, hard mass of the sigmoid colon.  Indications for Procedure:  This is a 52 y.o. male who presents with abdominal pain and peritonitis and workup revealing pneumoperitoneum.  The risks of bleeding, abscess or infection, injury to surrounding structures, and need for further procedures were all discussed with the patient and was willing to proceed.  Description of Procedure: The patient was correctly identified in the preoperative area and brought into the operating room.  The patient was placed supine with VTE prophylaxis in place.  Appropriate time-outs were performed.  Anesthesia was induced and the patient was intubated.  Foley catheter was placed.  Appropriate antibiotics were infused.  The abdomen was prepped and draped in a sterile fashion.  A midline incision was made and electrocautery was used to dissect down the subcutaneous tissue to the fascia.  The fascia was incised and extended superiorly and inferiorly.  The abdomen was explored and immediately a necrotic cecum was noted, with two areas of perforation.  These were sutured with 3-0 Silk to control spillage.  A section of small bowel at the terminal ileum was identified for proximal margin and a window in  the mesentery was created and the terminal ileum was stapled using GIA 75 mm blue load.  LigaSure was used to cauterize and cut the mesentery from terminal ileum towards the cecum.  The colon was mobilized along the white line of Toldt towards the hepatic flexure.  The hepatic flexure was mobilized, making sure the duodenum was kept intact.  The omentum in the proximal transverse colon was resected off the colon and another window in the mesentery was created for the distal margin.  This was divided using another blue 75 mm load.  LigaSure was used to divide the mesentery of the right colon and the right colon including terminal ileum and appendix was sent off as a specimen.  Then attention was turned to the sigmoid colon, which revealed an area of hard mass measuring about 4 cm in length, which was scarred onto the pelvic sidewall. This was dissected carefully using cautery and blunt dissection.  A distal margin more than 5 cm from the mass was identified and a window in the mesentery was created.  A contour green load stapler was used to divide the rectosigmoid.  Two 2-0 Proline sutures were used to mark the staple line of the rectal stump for future identification.  The sigmoid was mobilized along the white line of Toldt proximally towards the splenic flexure.  A window was created in the mesentery of the proximal margin and a blue load was used to divide the descending colon.  LigaSure was used to divide the mesentery, including the roots of the IMA and left colic bundles.  This was sent off as a second specimen.  The bowel was run to confirm no  other perforation or injury.  NG tube location was confirmed with palpation.  A section of the distal right omentum appeared ischemic and it was resected.  The abdomen was then thoroughly irrigated.  60 ml of Exparel solution was infiltrated onto the peritoneum, fascia, and subcutaneous tissue.  Two 19 Fr drains were placed, going from RUQ towards where the cecum used to  be, and LLQ going towards the pelvis.  These were secured to the skin using 3-0 Nylon.  Two skin openings were created in the RLQ for end ileostomy and LUQ for end colostomy.  A skin island was created and cut out down to the fascia, which was incised with a cruciate incision, then separating the muscle fibers and another cruciate incision at the posterior sheath and peritoneum.  The end ileostomy was brought out through the RLQ opening and end colostomy through the LUQ opening.  The abdomen was then closed using #1 PDS suture x 2 and the skin was closed with staples over a 1/4 inch penrose drain.  The two ostomies were then matured in standard fashion.  Both were then digitized revealing a patent ostomy without any tightness.   The abdomen was then cleaned and the midline incision was dressed with 4x4 gauze and tape, ostomy appliances were placed over each ostomy, and the two drains were dressed with 4x4 gauze and Tegaderm.  The patient was emerged from anesthesia and extubated and brought to the recovery room for further management.  The patient tolerated the procedure well and all counts were correct at the end of the case.   Howie IllJose Luis Tadan Shill, MD

## 2018-06-25 NOTE — Progress Notes (Signed)
[facility] Progress Note  1 Day Post-Op   Subjective: No acute events overnight. He reports abdominal soreness but is otherwise feeling better. No complaints of fever, chills, nausea. Remains NPO with NGT in place. Not mobilize yet.   Objective: Vital signs in last 24 hours: Temp:  [98 F (36.7 C)-98.8 F (37.1 C)] 98 F (36.7 C) (11/22 0527) Pulse Rate:  [86-113] 95 (11/22 0527) Resp:  [11-20] 18 (11/22 0527) BP: (105-135)/(74-99) 110/84 (11/22 0527) SpO2:  [93 %-100 %] 95 % (11/22 0527) Weight:  [74.4 kg] 74.4 kg (11/21 1102) Last BM Date: 06/24/18  Intake/Output from previous day: 11/21 0701 - 11/22 0700 In: 3073.6 [I.V.:3043.8; IV Piggyback:29.8] Out: 835 [Urine:625; Drains:110; Blood:100] Intake/Output this shift: Total I/O In: -  Out: 175 [Stool:175]  PE: Gen:  Alert, NAD, pleasant Pulm:  Normal effort Abd: Soft,incisional tenderness, non-distended, colostomy in LUQ with brown stool in bag likely from bowel decompression, ileostomy in RLQ with serosanguinous fluid in bag without stool, JP drains in LUQ and RLQ with serosanguinous fluid in both bulbs.  Skin: warm and dry, no rashes  Psych: A&Ox3   Lab Results:  Recent Labs    06/24/18 1117 06/25/18 0537  WBC 8.8 4.1  HGB 14.6 12.7*  HCT 44.3 38.6*  PLT 417* 274   BMET Recent Labs    06/24/18 1117 06/25/18 0537  NA 132* 137  K 4.0 4.4  CL 93* 104  CO2 27 27  GLUCOSE 339* 215*  BUN 27* 30*  CREATININE 1.61* 1.52*  CALCIUM 8.8* 7.7*   PT/INR No results for input(s): LABPROT, INR in the last 72 hours. CMP     Component Value Date/Time   NA 137 06/25/2018 0537   NA 140 04/16/2013 1639   K 4.4 06/25/2018 0537   K 3.5 04/16/2013 1639   CL 104 06/25/2018 0537   CL 109 (H) 04/16/2013 1639   CO2 27 06/25/2018 0537   CO2 23 04/16/2013 1639   GLUCOSE 215 (H) 06/25/2018 0537   GLUCOSE 137 (H) 04/16/2013 1639   BUN 30 (H) 06/25/2018 0537   BUN 15 04/16/2013 1639   CREATININE 1.52  (H) 06/25/2018 0537   CREATININE 0.90 04/16/2013 1639   CALCIUM 7.7 (L) 06/25/2018 0537   CALCIUM 8.2 (L) 04/16/2013 1639   PROT 7.4 06/24/2018 1117   PROT 7.4 04/16/2013 1639   ALBUMIN 3.9 06/24/2018 1117   ALBUMIN 3.7 04/16/2013 1639   AST 29 06/24/2018 1117   AST 27 04/16/2013 1639   ALT 44 06/24/2018 1117   ALT 29 04/16/2013 1639   ALKPHOS 79 06/24/2018 1117   ALKPHOS 78 04/16/2013 1639   BILITOT 2.1 (H) 06/24/2018 1117   BILITOT 0.2 04/16/2013 1639   GFRNONAA 51 (L) 06/25/2018 0537   GFRNONAA >60 04/16/2013 1639   GFRAA 59 (L) 06/25/2018 0537   GFRAA >60 04/16/2013 1639   Lipase     Component Value Date/Time   LIPASE 27 06/24/2018 1117       Studies/Results: Ct Abdomen Pelvis W Contrast  Result Date: 06/24/2018 CLINICAL DATA:  Generalized abdominal pain. Diarrhea and vomiting. Personal history of stomach ulcer. EXAM: CT ABDOMEN AND PELVIS WITH CONTRAST TECHNIQUE: Multidetector CT imaging of the abdomen and pelvis was performed using the standard protocol following bolus administration of intravenous contrast. CONTRAST:  75mL OMNIPAQUE IOHEXOL 300 MG/ML  SOLN COMPARISON:  None. FINDINGS: Lower chest: The lung bases demonstrate mild dependent atelectasis, right greater than left. Heart size is upper limits of normal. No  significant pericardial effusion is present. Hepatobiliary: No focal liver abnormality is seen. No gallstones, gallbladder wall thickening, or biliary dilatation. There is some fluid about the free edge of the liver. Pancreas: Unremarkable. No pancreatic ductal dilatation or surrounding inflammatory changes. Spleen: Mild fluid is present about the spleen no discrete splenic lesions are present. Adrenals/Urinary Tract: The adrenal glands are normal bilaterally. Kidneys and ureters are within normal limits. The urinary bladder is unremarkable. Stomach/Bowel: Stomach duodenum are within normal limits. There is gas throughout the small and large bowel mesentery. Small  bowel is otherwise unremarkable. The cecum is dilated. Extensive pneumatosis is present in the cecum. Hepatic flexure and transverse colon are within normal limits. Proximal descending colon is unremarkable. There is marked wall thickening and narrowing of the proximal sigmoid, concerning for neoplasm. Vascular/Lymphatic: No significant adenopathy is present. No vascular lesions are present. Reproductive: Prostate is unremarkable. Other: Scattered free fluid is present. There is also gas along the sigmoid colon. No abscess formation is present. Musculoskeletal: Vertebral body heights alignment are maintained. Moderate degenerative changes and disc disease is present at L4-5. There is central canal and foraminal narrowing at L4-5 greater than L3-4. Bony pelvis is within normal limits. No focal lytic or blastic lesions are present. IMPRESSION: 1. Pneumoperitoneum. 2. 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 3. Pneumatosis in the cecum. This may reflect the sequelae of obstruction secondary to the sigmoid mass. 4. Free fluid is reactive. 5. No evidence for metastatic disease to lymph nodes or liver. Critical Value/emergent results were called by telephone at the time of interpretation on 06/24/2018 at 4:30 pm to Dr. Sharman CheekPHILLIP STAFFORD , who verbally acknowledged these results. Electronically Signed   By: Marin Robertshristopher  Mattern M.D.   On: 06/24/2018 16:33    Anti-infectives: Anti-infectives (From admission, onward)   Start     Dose/Rate Route Frequency Ordered Stop   06/25/18 0030  piperacillin-tazobactam (ZOSYN) IVPB 3.375 g     3.375 g 12.5 mL/hr over 240 Minutes Intravenous Every 8 hours 06/24/18 1741     06/24/18 1600  piperacillin-tazobactam (ZOSYN) IVPB 3.375 g     3.375 g 100 mL/hr over 30 Minutes Intravenous  Once 06/24/18 1547 06/24/18 1709       Assessment/Plan  Bowel  Perforation Alexander Carpenter is a 52 y.o. 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    - NPO, IVF  - Continue NGT decompression until return of bowel function from ileostomy  - Continue foley for 1 more day  - Monitor abdominal examination and on-going bowel function particularly from ileostomy  - Pain control PRN, Anti-emetics PRN  - IV ABx (Zosyn)  - Awaiting surgical pathology, pending results will consult oncology  - Mobilize as tolerates  - DVT Prophylaxis (Heparin)   Lynden OxfordZachary Skipper Dacosta , PA-C [facility] 06/25/2018, 8:28 AM [phone] M-F: 7am - 4pm

## 2018-06-26 LAB — CBC
HCT: 36.6 % — ABNORMAL LOW (ref 39.0–52.0)
HEMOGLOBIN: 11.5 g/dL — AB (ref 13.0–17.0)
MCH: 27.8 pg (ref 26.0–34.0)
MCHC: 31.4 g/dL (ref 30.0–36.0)
MCV: 88.4 fL (ref 80.0–100.0)
Platelets: 284 10*3/uL (ref 150–400)
RBC: 4.14 MIL/uL — AB (ref 4.22–5.81)
RDW: 14.9 % (ref 11.5–15.5)
WBC: 4.9 10*3/uL (ref 4.0–10.5)
nRBC: 0 % (ref 0.0–0.2)

## 2018-06-26 LAB — BASIC METABOLIC PANEL
ANION GAP: 6 (ref 5–15)
BUN: 25 mg/dL — ABNORMAL HIGH (ref 6–20)
CHLORIDE: 105 mmol/L (ref 98–111)
CO2: 28 mmol/L (ref 22–32)
Calcium: 8 mg/dL — ABNORMAL LOW (ref 8.9–10.3)
Creatinine, Ser: 1.06 mg/dL (ref 0.61–1.24)
GFR calc Af Amer: >60 mL/min (ref >60)
GFR calc non Af Amer: >60 mL/min (ref >60)
Glucose, Bld: 181 mg/dL — ABNORMAL HIGH (ref 70–99)
POTASSIUM: 4 mmol/L (ref 3.5–5.1)
SODIUM: 139 mmol/L (ref 135–145)

## 2018-06-26 LAB — CEA: CEA1: 3.4 ng/mL (ref 0.0–4.7)

## 2018-06-26 LAB — HIV ANTIBODY (ROUTINE TESTING W REFLEX): HIV Screen 4th Generation wRfx: NONREACTIVE

## 2018-06-26 MED ORDER — MENTHOL 3 MG MT LOZG
1.0000 | LOZENGE | OROMUCOSAL | Status: DC | PRN
  Filled 2018-06-26: qty 9

## 2018-06-26 NOTE — Progress Notes (Signed)
[facility] Progress Note  2 Days Post-Op   Subjective: No acute events overnight. He reports minimal discomfort in his abdomen. His NGT is irritating his throat No complaints of fever, chills, nausea. Remains NPO with NGT in place, however a 1/2 full can of juice was found in his bed with him. He states that he had only had a few sips. .   Objective: Vital signs in last 24 hours: Temp:  [98.5 F (36.9 C)-98.9 F (37.2 C)] 98.9 F (37.2 C) (11/23 1054) Pulse Rate:  [85-95] 85 (11/23 1054) Resp:  [16-18] 18 (11/23 1054) BP: (111-122)/(81-91) 122/90 (11/23 1054) SpO2:  [95 %-99 %] 97 % (11/23 1054) Last BM Date: 06/26/18  Intake/Output from previous day: 11/22 0701 - 11/23 0700 In: 2629.7 [I.V.:2476.8; IV Piggyback:153] Out: 2295 [Urine:1075; Drains:245; Stool:975] Intake/Output this shift: Total I/O In: 698.8 [I.V.:672.7; IV Piggyback:26.1] Out: 630 [Drains:105; Stool:525]  PE: Gen:  Alert, NAD, pleasant Pulm:  Normal effort Abd: Soft,incisional tenderness, non-distended, colostomy in LUQ with brown stool in bag likely from bowel decompression, ileostomy in RLQ with serosanguinous fluid in bag without stool, JP drains in LUQ and RLQ with serosanguinous fluid in both bulbs.  Skin: warm and dry, no rashes  Psych: A&Ox3   Lab Results:  Recent Labs    06/25/18 0537 06/26/18 1120  WBC 4.1 4.9  HGB 12.7* 11.5*  HCT 38.6* 36.6*  PLT 274 284   BMET Recent Labs    06/24/18 1117 06/25/18 0537  NA 132* 137  K 4.0 4.4  CL 93* 104  CO2 27 27  GLUCOSE 339* 215*  BUN 27* 30*  CREATININE 1.61* 1.52*  CALCIUM 8.8* 7.7*   PT/INR No results for input(s): LABPROT, INR in the last 72 hours. CMP     Component Value Date/Time   NA 137 06/25/2018 0537   NA 140 04/16/2013 1639   K 4.4 06/25/2018 0537   K 3.5 04/16/2013 1639   CL 104 06/25/2018 0537   CL 109 (H) 04/16/2013 1639   CO2 27 06/25/2018 0537   CO2 23 04/16/2013 1639   GLUCOSE 215 (H)  06/25/2018 0537   GLUCOSE 137 (H) 04/16/2013 1639   BUN 30 (H) 06/25/2018 0537   BUN 15 04/16/2013 1639   CREATININE 1.52 (H) 06/25/2018 0537   CREATININE 0.90 04/16/2013 1639   CALCIUM 7.7 (L) 06/25/2018 0537   CALCIUM 8.2 (L) 04/16/2013 1639   PROT 7.4 06/24/2018 1117   PROT 7.4 04/16/2013 1639   ALBUMIN 3.9 06/24/2018 1117   ALBUMIN 3.7 04/16/2013 1639   AST 29 06/24/2018 1117   AST 27 04/16/2013 1639   ALT 44 06/24/2018 1117   ALT 29 04/16/2013 1639   ALKPHOS 79 06/24/2018 1117   ALKPHOS 78 04/16/2013 1639   BILITOT 2.1 (H) 06/24/2018 1117   BILITOT 0.2 04/16/2013 1639   GFRNONAA 51 (L) 06/25/2018 0537   GFRNONAA >60 04/16/2013 1639   GFRAA 59 (L) 06/25/2018 0537   GFRAA >60 04/16/2013 1639   Lipase     Component Value Date/Time   LIPASE 27 06/24/2018 1117       Studies/Results: Ct Abdomen Pelvis W Contrast  Result Date: 06/24/2018 CLINICAL DATA:  Generalized abdominal pain. Diarrhea and vomiting. Personal history of stomach ulcer. EXAM: CT ABDOMEN AND PELVIS WITH CONTRAST TECHNIQUE: Multidetector CT imaging of the abdomen and pelvis was performed using the standard protocol following bolus administration of intravenous contrast. CONTRAST:  75mL OMNIPAQUE IOHEXOL 300 MG/ML  SOLN COMPARISON:  None. FINDINGS:  Lower chest: The lung bases demonstrate mild dependent atelectasis, right greater than left. Heart size is upper limits of normal. No significant pericardial effusion is present. Hepatobiliary: No focal liver abnormality is seen. No gallstones, gallbladder wall thickening, or biliary dilatation. There is some fluid about the free edge of the liver. Pancreas: Unremarkable. No pancreatic ductal dilatation or surrounding inflammatory changes. Spleen: Mild fluid is present about the spleen no discrete splenic lesions are present. Adrenals/Urinary Tract: The adrenal glands are normal bilaterally. Kidneys and ureters are within normal limits. The urinary bladder is  unremarkable. Stomach/Bowel: Stomach duodenum are within normal limits. There is gas throughout the small and large bowel mesentery. Small bowel is otherwise unremarkable. The cecum is dilated. Extensive pneumatosis is present in the cecum. Hepatic flexure and transverse colon are within normal limits. Proximal descending colon is unremarkable. There is marked wall thickening and narrowing of the proximal sigmoid, concerning for neoplasm. Vascular/Lymphatic: No significant adenopathy is present. No vascular lesions are present. Reproductive: Prostate is unremarkable. Other: Scattered free fluid is present. There is also gas along the sigmoid colon. No abscess formation is present. Musculoskeletal: Vertebral body heights alignment are maintained. Moderate degenerative changes and disc disease is present at L4-5. There is central canal and foraminal narrowing at L4-5 greater than L3-4. Bony pelvis is within normal limits. No focal lytic or blastic lesions are present. IMPRESSION: 1. Pneumoperitoneum. 2. 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 3. Pneumatosis in the cecum. This may reflect the sequelae of obstruction secondary to the sigmoid mass. 4. Free fluid is reactive. 5. No evidence for metastatic disease to lymph nodes or liver. Critical Value/emergent results were called by telephone at the time of interpretation on 06/24/2018 at 4:30 pm to Dr. Sharman CheekPHILLIP STAFFORD , who verbally acknowledged these results. Electronically Signed   By: Marin Robertshristopher  Mattern M.D.   On: 06/24/2018 16:33    Anti-infectives: Anti-infectives (From admission, onward)   Start     Dose/Rate Route Frequency Ordered Stop   06/25/18 0030  piperacillin-tazobactam (ZOSYN) IVPB 3.375 g     3.375 g 12.5 mL/hr over 240 Minutes Intravenous Every 8 hours 06/24/18 1741     06/24/18 1600  piperacillin-tazobactam (ZOSYN) IVPB 3.375 g      3.375 g 100 mL/hr over 30 Minutes Intravenous  Once 06/24/18 1547 06/24/18 1709       Assessment/Plan  Bowel Perforation Kellyn Sonia SideGuzman Galindo is a 52 y.o. 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    - NPO, IVF; I discussed the need to not even sip on juice for now.  - Continue NGT decompression until return of bowel function from ileostomy  - will check BMP today; if creatinine improving, will d/c foley.  - Monitor abdominal examination and on-going bowel function particularly from ileostomy  - Pain control PRN, Anti-emetics PRN  - IV ABx (Zosyn)  - Awaiting surgical pathology, pending results will consult oncology  - Mobilize as tolerates  - DVT Prophylaxis (Heparin)   Duanne GuessJennifer Dionne Knoop 06/26/18 11:48 AM

## 2018-06-27 NOTE — Progress Notes (Signed)
[facility] Progress Note  3 Days Post-Op   Subjective: Mr. Allena Katz removed his NG tube overnight and has refused to have it replaced.  He states that the presence of the tube causes him much irritation and a lot of phlegm that he feels that he cannot clear her cough up so long as the tube is in place.  His pain has been well controlled and he denies any nausea or vomiting.  Objective: Vital signs in last 24 hours: Temp:  [98.3 F (36.8 C)-98.9 F (37.2 C)] 98.9 F (37.2 C) (11/24 1149) Pulse Rate:  [71-79] 71 (11/24 1149) Resp:  [16-20] 16 (11/24 1149) BP: (130-139)/(77-96) 139/96 (11/24 1149) SpO2:  [96 %-98 %] 98 % (11/24 1149) Last BM Date: (colostomy is producing)  Intake/Output from previous day: 11/23 0701 - 11/24 0700 In: 3423.6 [I.V.:3188; IV Piggyback:235.6] Out: 4670 [Urine:2690; Emesis/NG output:25; Drains:330; Stool:1625] Intake/Output this shift: Total I/O In: 299.1 [P.O.:120; I.V.:179.1] Out: 1590 [Urine:750; Drains:40; Stool:800]  PE: Gen:  Alert, NAD, pleasant Pulm:  Normal effort Abd: Soft,incisional tenderness, non-distended, colostomy in LUQ with brown stool in bag likely from bowel decompression, ileostomy in RLQ with bag nearly full of dark liquid stool, JP drains in LUQ and RLQ with serosanguinous fluid in both bulbs.  Skin: warm and dry, no rashes  Psych: A&Ox3   Lab Results:  Recent Labs    06/25/18 0537 06/26/18 1120  WBC 4.1 4.9  HGB 12.7* 11.5*  HCT 38.6* 36.6*  PLT 274 284   BMET Recent Labs    06/25/18 0537 06/26/18 1120  NA 137 139  K 4.4 4.0  CL 104 105  CO2 27 28  GLUCOSE 215* 181*  BUN 30* 25*  CREATININE 1.52* 1.06  CALCIUM 7.7* 8.0*   PT/INR No results for input(s): LABPROT, INR in the last 72 hours. CMP     Component Value Date/Time   NA 139 06/26/2018 1120   NA 140 04/16/2013 1639   K 4.0 06/26/2018 1120   K 3.5 04/16/2013 1639   CL 105 06/26/2018 1120   CL 109 (H) 04/16/2013 1639   CO2 28  06/26/2018 1120   CO2 23 04/16/2013 1639   GLUCOSE 181 (H) 06/26/2018 1120   GLUCOSE 137 (H) 04/16/2013 1639   BUN 25 (H) 06/26/2018 1120   BUN 15 04/16/2013 1639   CREATININE 1.06 06/26/2018 1120   CREATININE 0.90 04/16/2013 1639   CALCIUM 8.0 (L) 06/26/2018 1120   CALCIUM 8.2 (L) 04/16/2013 1639   PROT 7.4 06/24/2018 1117   PROT 7.4 04/16/2013 1639   ALBUMIN 3.9 06/24/2018 1117   ALBUMIN 3.7 04/16/2013 1639   AST 29 06/24/2018 1117   AST 27 04/16/2013 1639   ALT 44 06/24/2018 1117   ALT 29 04/16/2013 1639   ALKPHOS 79 06/24/2018 1117   ALKPHOS 78 04/16/2013 1639   BILITOT 2.1 (H) 06/24/2018 1117   BILITOT 0.2 04/16/2013 1639   GFRNONAA >60 06/26/2018 1120   GFRNONAA >60 04/16/2013 1639   GFRAA >60 06/26/2018 1120   GFRAA >60 04/16/2013 1639   Lipase     Component Value Date/Time   LIPASE 27 06/24/2018 1117       Studies/Results: No results found.  Anti-infectives: Anti-infectives (From admission, onward)   Start     Dose/Rate Route Frequency Ordered Stop   06/25/18 0030  piperacillin-tazobactam (ZOSYN) IVPB 3.375 g     3.375 g 12.5 mL/hr over 240 Minutes Intravenous Every 8 hours 06/24/18 1741     06/24/18  1600  piperacillin-tazobactam (ZOSYN) IVPB 3.375 g     3.375 g 100 mL/hr over 30 Minutes Intravenous  Once 06/24/18 1547 06/24/18 1709       Assessment/Plan  Bowel Perforation [place] Sonia SideGuzman Galindo is a 52 y.o. 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    - Due to return of bowel function, will initiate sips of clear liquid diet.  Patient cautioned to proceed slowly.  - foley d/c'd  - Monitor abdominal examination   - wound/ostomy consult placed  - Pain control PRN, Anti-emetics PRN  - IV ABx  (Zosyn)  - Awaiting surgical pathology, pending results will consult oncology  - Mobilize as tolerates  - DVT Prophylaxis (Heparin)   Duanne GuessJennifer Wildon Cuevas 06/27/18 4:04 PM

## 2018-06-28 MED ORDER — KETOROLAC TROMETHAMINE 15 MG/ML IJ SOLN
30.0000 mg | Freq: Four times a day (QID) | INTRAMUSCULAR | Status: AC
  Administered 2018-06-28 – 2018-06-29 (×5): 30 mg via INTRAVENOUS
  Filled 2018-06-28 (×5): qty 2

## 2018-06-28 MED ORDER — PANTOPRAZOLE SODIUM 40 MG PO TBEC
40.0000 mg | DELAYED_RELEASE_TABLET | Freq: Every day | ORAL | Status: DC
  Administered 2018-06-28 – 2018-06-30 (×3): 40 mg via ORAL
  Filled 2018-06-28 (×3): qty 1

## 2018-06-28 MED ORDER — FOLIC ACID 1 MG PO TABS
1.0000 mg | ORAL_TABLET | Freq: Every day | ORAL | Status: DC
  Administered 2018-06-28 – 2018-06-30 (×3): 1 mg via ORAL
  Filled 2018-06-28 (×3): qty 1

## 2018-06-28 MED ORDER — LACTATED RINGERS IV SOLN
INTRAVENOUS | Status: DC
  Administered 2018-06-28 – 2018-06-29 (×3): via INTRAVENOUS

## 2018-06-28 NOTE — Consult Note (Signed)
WOC Nurse ostomy follow up Patient is spanish speaking, so I utilized Production designer, theatre/television/filminterpreter interactive services.  Protected patient privacy throughout session. Stoma type/location: RLQ ileostomy-high output LLQ colostomy Stomal assessment/size: colostomy:  2" pink patent and producing brown stool RLQ ileostomy liquid green effluent Peristomal assessment: intact/midline staple line, RLQ drain Abdomen with hair and suggested clipping or dry powder shave. Treatment options for stomal/peristomal skin: Barrier ring and 2 piece system.   2 3/4" LLQ colostomy and  2 1/4" for ileostomy Changed twice weekly and emptied when 1/3 full.  Discussed activity.  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 No insurance and will try and get him in indigent program through [employer] for supplies.  Output see above Ostomy pouching: 2pc with barrier ring.  Education provided:  Enrolled [facility] in [employer] Discharge program: Yes will today WOC team will follow.   Maple HudsonKaren Kayda Allers MSN, RN, FNP-BC CWON Wound, Ostomy, Continence Nurse Pager [phone]

## 2018-06-28 NOTE — Progress Notes (Signed)
[facility] Progress Note  4 Days Post-Op  Subjective: No acute events overnight. Patient reports continues improvement in his discomfort. No complaints of nausea or emesis. Ostomy with good output. Tolerating full liquids. Mobilizing well.   Objective: Vital signs in last 24 hours: Temp:  [98.3 F (36.8 C)-99.8 F (37.7 C)] 98.3 F (36.8 C) (11/25 0606) Pulse Rate:  [66-71] 66 (11/25 0606) Resp:  [16-20] 20 (11/25 0606) BP: (139-159)/(94-96) 141/94 (11/25 0606) SpO2:  [98 %] 98 % (11/25 0606) Last BM Date: 06/27/18  Intake/Output from previous day: 11/24 0701 - 11/25 0700 In: 2000.4 [P.O.:320; I.V.:1583.3; IV Piggyback:97.1] Out: 4200 [Urine:2300; Drains:190; Stool:1710] Intake/Output this shift: Total I/O In: 1418.1 [I.V.:1361.2; IV Piggyback:56.9] Out: 175 [Stool:175]  PE: Gen:  Alert, NAD, pleasant Pulm:  Normal effort Abd: Soft, incisional tenderness, non-distended, colostomy in LUQ with brown stool in bag likely from bowel decompression, ileostomy in RLQ with bag nearly full of dark liquid stool, JP drains in LUQ and RLQ with serosanguinous fluid in both bulbs.  Skin: warm and dry, no rashes  Psych: A&Ox3    Lab Results:  Recent Labs    06/26/18 1120  WBC 4.9  HGB 11.5*  HCT 36.6*  PLT 284   BMET Recent Labs    06/26/18 1120  NA 139  K 4.0  CL 105  CO2 28  GLUCOSE 181*  BUN 25*  CREATININE 1.06  CALCIUM 8.0*   PT/INR No results for input(s): LABPROT, INR in the last 72 hours. CMP     Component Value Date/Time   NA 139 06/26/2018 1120   NA 140 04/16/2013 1639   K 4.0 06/26/2018 1120   K 3.5 04/16/2013 1639   CL 105 06/26/2018 1120   CL 109 (H) 04/16/2013 1639   CO2 28 06/26/2018 1120   CO2 23 04/16/2013 1639   GLUCOSE 181 (H) 06/26/2018 1120   GLUCOSE 137 (H) 04/16/2013 1639   BUN 25 (H) 06/26/2018 1120   BUN 15 04/16/2013 1639   CREATININE 1.06 06/26/2018 1120   CREATININE 0.90 04/16/2013 1639   CALCIUM 8.0 (L)  06/26/2018 1120   CALCIUM 8.2 (L) 04/16/2013 1639   PROT 7.4 06/24/2018 1117   PROT 7.4 04/16/2013 1639   ALBUMIN 3.9 06/24/2018 1117   ALBUMIN 3.7 04/16/2013 1639   AST 29 06/24/2018 1117   AST 27 04/16/2013 1639   ALT 44 06/24/2018 1117   ALT 29 04/16/2013 1639   ALKPHOS 79 06/24/2018 1117   ALKPHOS 78 04/16/2013 1639   BILITOT 2.1 (H) 06/24/2018 1117   BILITOT 0.2 04/16/2013 1639   GFRNONAA >60 06/26/2018 1120   GFRNONAA >60 04/16/2013 1639   GFRAA >60 06/26/2018 1120   GFRAA >60 04/16/2013 1639   Lipase     Component Value Date/Time   LIPASE 27 06/24/2018 1117       Studies/Results: No results found.  Anti-infectives: Anti-infectives (From admission, onward)   Start     Dose/Rate Route Frequency Ordered Stop   06/25/18 0030  piperacillin-tazobactam (ZOSYN) IVPB 3.375 g     3.375 g 12.5 mL/hr over 240 Minutes Intravenous Every 8 hours 06/24/18 1741     06/24/18 1600  piperacillin-tazobactam (ZOSYN) IVPB 3.375 g     3.375 g 100 mL/hr over 30 Minutes Intravenous  Once 06/24/18 1547 06/24/18 1709       Assessment/Plan  Bowel Perforation Ruari Sonia Side is a 52 y.o. male who is doing well 4 day s/p Exploratory Laparotomy, right colectomy with creation of  end ileostomy, sigmoidectomy with creation of end colostomy, partial omentectomy for bowel perforation and concern for sigmoid mass which is further complicated by pertinent comorbidities including HLD.    - Advance to soft diet, continue IVF for now  - Monitor ongoing bowel function and abdominal examination, will consider imodium after monitoring ostomy output on soft diet   - Wound Ostomy following  - Pain control PRN, Anti-emetics PRN             - IV ABx (Zosyn)             - Awaiting surgical pathology, pending results will consult oncology             - Mobilize as tolerates             - DVT Prophylaxis (Heparin)    Lynden OxfordZachary Sherika Kubicki , PA-C [facility] 06/28/2018, 10:28  AM [phone] M-F: 7am - 4pm

## 2018-06-28 NOTE — Anesthesia Postprocedure Evaluation (Signed)
Anesthesia Post Note  Patient: Chief Financial OfficerMargarito Guzman Carpenter  Procedure(s) Performed: EXPLORATORY LAPAROTOMY (N/A )  Patient location during evaluation: PACU Anesthesia Type: General Level of consciousness: awake and alert Pain management: pain level controlled Vital Signs Assessment: post-procedure vital signs reviewed and stable Respiratory status: spontaneous breathing, nonlabored ventilation, respiratory function stable and patient connected to nasal cannula oxygen Cardiovascular status: blood pressure returned to baseline and stable Postop Assessment: no apparent nausea or vomiting Anesthetic complications: no     Last Vitals:  Vitals:   06/28/18 1111 06/28/18 2111  BP: (!) 144/75 (!) 139/94  Pulse: 73 66  Resp: 19 20  Temp: 36.8 C 37.1 C  SpO2: 97% 98%    Last Pain:  Vitals:   06/28/18 2111  TempSrc: Oral  PainSc:                  Yevette EdwardsJames G Beronica Lansdale

## 2018-06-29 LAB — CBC
HCT: 38.7 % — ABNORMAL LOW (ref 39.0–52.0)
HEMOGLOBIN: 12.5 g/dL — AB (ref 13.0–17.0)
MCH: 27.9 pg (ref 26.0–34.0)
MCHC: 32.3 g/dL (ref 30.0–36.0)
MCV: 86.4 fL (ref 80.0–100.0)
Platelets: 339 10*3/uL (ref 150–400)
RBC: 4.48 MIL/uL (ref 4.22–5.81)
RDW: 14.1 % (ref 11.5–15.5)
WBC: 7.1 10*3/uL (ref 4.0–10.5)
nRBC: 0 % (ref 0.0–0.2)

## 2018-06-29 LAB — SURGICAL PATHOLOGY

## 2018-06-29 MED ORDER — LOPERAMIDE HCL 2 MG PO CAPS
2.0000 mg | ORAL_CAPSULE | Freq: Two times a day (BID) | ORAL | Status: DC
  Administered 2018-06-29 – 2018-06-30 (×3): 2 mg via ORAL
  Filled 2018-06-29 (×3): qty 1

## 2018-06-29 MED ORDER — PSYLLIUM 95 % PO PACK
1.0000 | PACK | Freq: Every day | ORAL | Status: DC
  Administered 2018-06-29: 1 via ORAL
  Filled 2018-06-29 (×2): qty 1

## 2018-06-29 NOTE — Care Management (Signed)
RNCM assessment completed with Spanish interpreter.  POD2  Exploratory Laparotomy, right colectomy with creation of end ileostomy, sigmoidectomy with creation of end colostomy, partial omentectomy..  Patient states that he lives at home "in a trailer with a friend".  Patient states that prior to admission he was employed and independent of all ADLS.  Patient states that he does not have a car of his own, and relies on transportation from his brother and sister in law.  Patient was not on any medication prior to admission.   [facility]  Follow up appointment was [facility] a at [facility]/03/18 at 9am with Shane CrutchLinda Markley.    Patient states that he will be staying at his brothers home after discharge.  His brother is Mr. Tedd SiasJimenez.  Per Mr. Tedd SiasJimenez the address he will be staying at is [address]  [place].   Patient agreeable to home health services if he is approved for charity care.  RNCM to reach out to Dr. Judithann SheenSparks to see if he will be agreeable to sign home health orders till patient follows up with PCP.  [facility]

## 2018-06-29 NOTE — Progress Notes (Signed)
[facility] Progress Note  5 Days Post-Op  Subjective: No acute events overnight. Patient reports that his abdominal pain has continued to improve. He denied any fever, chills, nausea, or emesis. He is tolerating full liquid diet. Mobilizing in the room.   Objective: Vital signs in last 24 hours: Temp:  [98.1 F (36.7 C)-98.7 F (37.1 C)] 98.1 F (36.7 C) (11/26 0440) Pulse Rate:  [66-73] 66 (11/26 0440) Resp:  [19-20] 20 (11/26 0440) BP: (129-144)/(75-94) 129/93 (11/26 0440) SpO2:  [97 %-99 %] 99 % (11/26 0440) Last BM Date: 06/27/18  Intake/Output from previous day: 11/25 0701 - 11/26 0700 In: 2570.1 [I.V.:2384.7; IV Piggyback:185.4] Out: 3515 [Urine:1900; Drains:125; Stool:1490] Intake/Output this shift: No intake/output data recorded.  PE: Gen: Alert, NAD, pleasant Pulm: Normal effort Abd: Soft, incisional tenderness, non-distended, colostomy in LUQ with brown stool in bag likely from bowel decompression, ileostomy in RLQwith dark liquid stool, JP drains in LUQ and RLQ with serosanguinous fluid in both bulbs.  Skin: warm and dry, no rashes  Psych: A&Ox3    Lab Results:  Recent Labs    06/26/18 1120 06/29/18 0606  WBC 4.9 7.1  HGB 11.5* 12.5*  HCT 36.6* 38.7*  PLT 284 339   BMET Recent Labs    06/26/18 1120  NA 139  K 4.0  CL 105  CO2 28  GLUCOSE 181*  BUN 25*  CREATININE 1.06  CALCIUM 8.0*   PT/INR No results for input(s): LABPROT, INR in the last 72 hours. CMP     Component Value Date/Time   NA 139 06/26/2018 1120   NA 140 04/16/2013 1639   K 4.0 06/26/2018 1120   K 3.5 04/16/2013 1639   CL 105 06/26/2018 1120   CL 109 (H) 04/16/2013 1639   CO2 28 06/26/2018 1120   CO2 23 04/16/2013 1639   GLUCOSE 181 (H) 06/26/2018 1120   GLUCOSE 137 (H) 04/16/2013 1639   BUN 25 (H) 06/26/2018 1120   BUN 15 04/16/2013 1639   CREATININE 1.06 06/26/2018 1120   CREATININE 0.90 04/16/2013 1639   CALCIUM 8.0 (L) 06/26/2018 1120    CALCIUM 8.2 (L) 04/16/2013 1639   PROT 7.4 06/24/2018 1117   PROT 7.4 04/16/2013 1639   ALBUMIN 3.9 06/24/2018 1117   ALBUMIN 3.7 04/16/2013 1639   AST 29 06/24/2018 1117   AST 27 04/16/2013 1639   ALT 44 06/24/2018 1117   ALT 29 04/16/2013 1639   ALKPHOS 79 06/24/2018 1117   ALKPHOS 78 04/16/2013 1639   BILITOT 2.1 (H) 06/24/2018 1117   BILITOT 0.2 04/16/2013 1639   GFRNONAA >60 06/26/2018 1120   GFRNONAA >60 04/16/2013 1639   GFRAA >60 06/26/2018 1120   GFRAA >60 04/16/2013 1639   Lipase     Component Value Date/Time   LIPASE 27 06/24/2018 1117       Studies/Results: No results found.  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               - Advance to soft diet             - Monitor ongoing bowel function and abdominal examination,              - Wound Ostomy following  - Start on Metamucil QD and 2 mg Imodium BID today to try and slow ostomy output and prevent dehydration             - Pain control PRN, Anti-emetics PRN - IV ABx (Zosyn) - Awaiting surgical pathology, pending results will consult oncology - Mobilize as tolerates - DVT Prophylaxis (Heparin)    Lynden Oxford ,  PA-C [facility] 06/29/2018, 8:48 AM [phone] M-F: 7am - 4pm

## 2018-06-30 MED ORDER — AMOXICILLIN-POT CLAVULANATE 875-125 MG PO TABS: 1.0000 | ORAL_TABLET | Freq: Two times a day (BID) | ORAL | 0 refills | Status: DC

## 2018-06-30 MED ORDER — PSYLLIUM 95 % PO PACK: 1.0000 | PACK | Freq: Every day | ORAL | 0 refills | Status: DC

## 2018-06-30 MED ORDER — IBUPROFEN 600 MG PO TABS: 600.0000 mg | ORAL_TABLET | Freq: Three times a day (TID) | ORAL | 0 refills | Status: DC | PRN

## 2018-06-30 MED ORDER — LOPERAMIDE HCL 2 MG PO CAPS: 2.0000 mg | ORAL_CAPSULE | Freq: Two times a day (BID) | ORAL | 0 refills | Status: DC | PRN

## 2018-06-30 MED ORDER — AMOXICILLIN-POT CLAVULANATE 875-125 MG PO TABS
1.0000 | ORAL_TABLET | Freq: Two times a day (BID) | ORAL | Status: DC
  Administered 2018-06-30: 1 via ORAL
  Filled 2018-06-30: qty 1

## 2018-06-30 MED ORDER — OXYCODONE HCL 5 MG PO TABS: 5.0000 mg | ORAL_TABLET | Freq: Four times a day (QID) | ORAL | 0 refills | Status: DC | PRN

## 2018-06-30 NOTE — Discharge Summary (Signed)
Patient ID: Alexander Carpenter MRN: [id number] DOB/AGE: 1966/02/05 52 y.o.  Admit date: 06/24/2018 Discharge date: 06/30/2018   Discharge Diagnoses:  Active Problems:   Pneumoperitoneum   Colonic mass   Procedures:  Exploratory laparotomy, right colectomy, sigmoidectomy, creation of end ileostomy and end colostomy.  Hospital Course: Patient was admitted on 11/21 with pneumoperitoneum.  He was taken to the OR emergently for exlap.  His cecum was necrotic and perforated and his sigmoid was hard and stenosed concerning for possible malignancy.  He tolerated the procedure well.  His NG tube was removed on POD#3, and was started on clear liquids. His diet was slowly advanced and was started on metamucil and imodium to slow down his ostomy output to less than a liter a day.  His antibiotic was changed from IV to po.  He was ambulating, pain was well controlled, voiding without issues, and was deemed ready for discharge.  He has two Blake drains, two ostomies, and a penrose drain in the midline incision.  Ostomy nurse has instructed him on managing the ostomies, and floor nurse has instructed him on managing his drains.  On exam, he was in no acute distress with stable vital signs.  Abdomen soft, nondistended, appropriately tender to palpation.  Midline incision is clean, without infection, with penrose drain and staples.  Blake drains with serosanguinous fluid.  Ostomies pink and viable.   Consults: Ostomy nurse  Disposition: Discharge disposition: 01-Home or Self Care       Discharge Instructions    Call MD for:  difficulty breathing, headache or visual disturbances   Complete by:  As directed    Call MD for:  persistant nausea and vomiting   Complete by:  As directed    Call MD for:  redness, tenderness, or signs of infection (pain, swelling, redness, odor or green/yellow discharge around incision site)   Complete by:  As directed    Call MD for:  severe uncontrolled pain    Complete by:  As directed    Call MD for:  temperature >100.4   Complete by:  As directed    Change dressing (specify)   Complete by:  As directed    Change dressings for midline incision and for drains once daily and as needed to keep the areas clean and dry.  Change with dry gauze and tape.   Diet - low sodium heart healthy   Complete by:  As directed    Discharge instructions   Complete by:  As directed    1.  Patient may shower, but do not scrub wounds heavily and dab dry only. 2.  Do not submerge wounds in pool/tub for 1 week. 3.  Do not remove staples. 4.  Empty each drain daily and record output.  Bring record with you to office appointment.   Driving Restrictions   Complete by:  As directed    Do not drive while taking narcotics for pain control.   Increase activity slowly   Complete by:  As directed    Lifting restrictions   Complete by:  As directed    No heavy lifting or pushing of more than 10-15 lbs for 4 weeks.     Allergies as of 06/30/2018      Reactions   Anesthesia S-i-40 [propofol] Other (See Comments)   Blurry vision and cold sweats      Medication List    TAKE these medications   amoxicillin-clavulanate 875-125 MG tablet Commonly known as:  AUGMENTIN Take 1  tablet by mouth every 12 (twelve) hours.   ibuprofen 600 MG tablet Commonly known as:  ADVIL,MOTRIN Take 1 tablet (600 mg total) by mouth every 8 (eight) hours as needed for fever, mild pain or moderate pain.   loperamide 2 MG capsule Commonly known as:  IMODIUM Take 1 capsule (2 mg total) by mouth 2 (two) times daily as needed for diarrhea or loose stools. Take a tablet twice daily as needed to keep the ostomy output no more than 1 liter per day.   oxyCODONE 5 MG immediate release tablet Commonly known as:  Oxy IR/ROXICODONE Take 1 tablet (5 mg total) by mouth every 6 (six) hours as needed for severe pain.   psyllium 95 % Pack Commonly known as:  HYDROCIL/METAMUCIL Take 1 packet by mouth  daily. Take 1 serving once daily            Discharge Care Instructions  (From admission, onward)         Start     Ordered   06/30/18 0000  Change dressing (specify)    Comments:  Change dressings for midline incision and for drains once daily and as needed to keep the areas clean and dry.  Change with dry gauze and tape.   06/30/18 1155         Follow-up Information    Kitana Gage, Elita QuickJose, MD Follow up in 1 week(s).   Specialty:  General Surgery Why:  Follow up in 1 week for staples and drain removal Contact information: [address] [place] [address] [phone]

## 2018-06-30 NOTE — Progress Notes (Signed)
Patient cleared for discharge. Awaiting ride. Discharge instructions provided in Spanish for better patient understanding. All questions answered. Prescriptions given. Drains emptied. Patient demonstrated he knows how to empty and record drainage. Supplies given to last patient until f/u. Understands incisions need to be kept clean and dry. IVs removed. Belongings gathered.  Discharged to home via POV

## 2018-06-30 NOTE — Consult Note (Signed)
WOC Nurse ostomy follow up Pouch changes for both stomas.  LLQ colostomy- needs 2 piece 2 3/4" pouch.  4 sets provided for discharge.  RLQ ileostomy 2 1/4" pouch Both need barrier ring Supplies given for home.  [facility]:  [address] [place] Will not follow at this time.  Please re-consult if needed. Discharging. Maple HudsonKaren Chastin Garlitz MSN, RN, FNP-BC CWON Wound, Ostomy, Continence Nurse Pager [phone]

## 2018-06-30 NOTE — Care Management Note (Signed)
Case Management Note  Patient Details  Name: Alexander Carpenter MRN: [id number] Date of Birth: [birth year]   Patient to discharge today.  Patient has been approved for charity care through [facility].  Brad with [facility] notified of discharge.    Patient to discharge to [address] [place].  Patient request to be contacted as his cell phone number.  This information provided to [facility].    Patient has been provided ostomy supplies from the hospital   Subjective/Objective:                    Action/Plan:   Expected Discharge Date:  06/30/18               Expected Discharge Plan:  Home w Home Health Services  In-House Referral:     Discharge planning Services  CM Consult  Post Acute Care Choice:  Home Health Choice offered to:  Patient  DME Arranged:    DME Agency:     HH Arranged:  RN HH Agency:  [facility]  Status of Service:  Completed, signed off  If discussed at Long Length of Stay Meetings, dates discussed:    Additional Comments:  Chapman FitchBOWEN, Dmetrius Ambs T, RN 06/30/2018, 5:05 PM

## 2018-07-05 ENCOUNTER — Telehealth: Payer: Self-pay | Admitting: *Deleted

## 2018-07-05 NOTE — Telephone Encounter (Signed)
Give Verbal order for nurse to help with wound care.

## 2018-07-05 NOTE — Telephone Encounter (Signed)
Sarah from [facility] needs verbal orders on this patient for continuing to care for patient. Please call and advise. [phone]

## 2018-07-07 ENCOUNTER — Other Ambulatory Visit: Payer: Self-pay

## 2018-07-07 ENCOUNTER — Encounter: Payer: Self-pay | Admitting: Surgery

## 2018-07-07 ENCOUNTER — Ambulatory Visit (INDEPENDENT_AMBULATORY_CARE_PROVIDER_SITE_OTHER): Payer: Self-pay | Admitting: Surgery

## 2018-07-07 VITALS — BP 116/80 | HR 88 | Temp 97.5°F | Resp 18 | Ht 64.0 in | Wt 151.6 lb

## 2018-07-07 DIAGNOSIS — K5732 Diverticulitis of large intestine without perforation or abscess without bleeding: Secondary | ICD-10-CM | POA: Insufficient documentation

## 2018-07-07 DIAGNOSIS — K3532 Acute appendicitis with perforation and localized peritonitis, without abscess: Secondary | ICD-10-CM | POA: Insufficient documentation

## 2018-07-07 DIAGNOSIS — K56609 Unspecified intestinal obstruction, unspecified as to partial versus complete obstruction: Secondary | ICD-10-CM | POA: Insufficient documentation

## 2018-07-07 DIAGNOSIS — Z09 Encounter for follow-up examination after completed treatment for conditions other than malignant neoplasm: Secondary | ICD-10-CM

## 2018-07-07 NOTE — Patient Instructions (Addendum)
Patient is to return to the office in 2 weeks with Dr.Piscoya plan for surgery in 3 months. Leave strips in place and allow them to fall off on their own. Change and dry wounds until healed.   Call the office with any questions or concerns.

## 2018-07-07 NOTE — Progress Notes (Signed)
07/07/2018  HPI: Alexander Carpenter is a 52 y.o. male s/p exploratory laparotomy, right colectomy, sigmoidectomy, creation of end ileostomy, and creation of end colostomy for cecal perforation due to severe sigmoid diverticulitis on 11/21.  He was discharged home on 11/27 on a course of Augmentin with 2 drains in place.  He presents today for follow-up.  He has been doing well his pain is improving and only a 2-3.  He does empty his end ileostomy but he is unsure as to how much volume comes out each day.  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  Midline incision is healing well with staples and a Penrose drain in place.  2 Blake drains in place with serosanguineous fluid.  End colostomy with stoma pink and viable with no contents in bag.  End ileostomy pink and viable with liquid stool in the bag.  Assessment/Plan: This is a 52 y.o. male s/p exploratory laparotomy, right colectomy, sigmoidectomy, creation of end ileostomy, and creation of end colostomy   -Removed both Blake drains without any complications.  Removed Penrose drain and staples and change them to Steri-Strips without any complications. - Instructed the patient to measure how much volume the end ileostomy puts out every day.  If it is more than 1 L, then he should start taking Imodium to slow his bowels down.  He is already doing Metamucil. -Patient will follow-up in 2 more weeks to see how he is healing.   Howie IllJose Luis Jachai Okazaki, MD [facility]

## 2018-07-21 ENCOUNTER — Encounter: Payer: Self-pay | Admitting: Surgery

## 2018-07-21 ENCOUNTER — Other Ambulatory Visit: Payer: Self-pay

## 2018-07-21 ENCOUNTER — Ambulatory Visit (INDEPENDENT_AMBULATORY_CARE_PROVIDER_SITE_OTHER): Payer: Self-pay | Admitting: Surgery

## 2018-07-21 ENCOUNTER — Ambulatory Visit: Payer: MEDICAID | Admitting: Surgery

## 2018-07-21 VITALS — BP 138/108 | HR 74 | Temp 98.1°F | Ht 64.0 in | Wt 157.2 lb

## 2018-07-21 DIAGNOSIS — K5732 Diverticulitis of large intestine without perforation or abscess without bleeding: Secondary | ICD-10-CM

## 2018-07-21 DIAGNOSIS — K56609 Unspecified intestinal obstruction, unspecified as to partial versus complete obstruction: Secondary | ICD-10-CM

## 2018-07-21 DIAGNOSIS — Z09 Encounter for follow-up examination after completed treatment for conditions other than malignant neoplasm: Secondary | ICD-10-CM

## 2018-07-21 DIAGNOSIS — K3532 Acute appendicitis with perforation and localized peritonitis, without abscess: Secondary | ICD-10-CM

## 2018-07-21 DIAGNOSIS — K668 Other specified disorders of peritoneum: Secondary | ICD-10-CM

## 2018-07-21 NOTE — Progress Notes (Signed)
07/21/2018  HPI: Alexander Carpenter is a 52 y.o. 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  He presents today for follow up.  His midline incision is doing well and he denies any pain, but he reports having a rash on the skin at the ileostomy site and a milder one at the colostomy site.  Vital signs: BP (!) 138/108   Pulse 74   Temp 98.1 F (36.7 C) (Temporal)   Ht 5\' 4"  (1.626 m)   Wt 157 lb 3.2 oz (71.3 kg)   SpO2 98%   BMI 26.98 kg/m    Physical Exam: Constitutional: No acute distress Abdomen:  Soft, nondistended, nontender to palpation.  Midline incision well healed.  Ileostomy pink and viable without hernia.  The skin under the plate has a mild rash, likely due to liquid stool leaking onto the skin.  No evidence of an abscess or infection.  Colostomy also pink and viable without hernia.  Milder rash compared to ileostomy, and also no evidence of abscess or infection.  Assessment/Plan: This is a 52 y.o. male s/p exlap, right colectomy, sigmoidectomy, end ileostomy, end colostomy.  --Reassured the patient that he does not have an infection and does not need any antibiotics at this point.  This rash is most likely due to leakage of stool or mucus onto the skin, which causes excoriation.  At this point he does not need nystatin either.   --Recommended that he needs better skin care while doing his ostomy care and appliance application.  Instructed that he call the RN aid that comes to his house for ostomy teaching so that he/she can come to the house today or tomorrow to instruct him better about barrier or protection ointments or materials that he can use so the appliance will stick better to the skin so he won't have further leaks. --If no improvement, patient should call us and come back to be seen.  Otherwise, at this point, he's doing very well and will follow up in 2 months so we can discuss surgery  to reconnect his intestines.   Alexander IllJose Luis Genoa Freyre, MD [facility]

## 2018-07-21 NOTE — Patient Instructions (Addendum)
Patient is to return to the office in 2 months, keep area clean and dry call nurse aid to help with ointment to help with placement of bag for better placement to keep fecal matter out of the area.

## 2018-07-23 ENCOUNTER — Ambulatory Visit: Payer: Self-pay | Admitting: Surgery

## 2018-09-22 ENCOUNTER — Ambulatory Visit: Payer: Self-pay | Admitting: Surgery

## 2018-10-01 ENCOUNTER — Ambulatory Visit (INDEPENDENT_AMBULATORY_CARE_PROVIDER_SITE_OTHER): Payer: Self-pay | Admitting: Surgery

## 2018-10-01 ENCOUNTER — Encounter: Payer: Self-pay | Admitting: Surgery

## 2018-10-01 VITALS — BP 139/86 | HR 72 | Temp 97.9°F | Ht 65.0 in | Wt 168.0 lb

## 2018-10-01 DIAGNOSIS — K3532 Acute appendicitis with perforation and localized peritonitis, without abscess: Secondary | ICD-10-CM

## 2018-10-01 DIAGNOSIS — K5732 Diverticulitis of large intestine without perforation or abscess without bleeding: Secondary | ICD-10-CM

## 2018-10-01 DIAGNOSIS — K56609 Unspecified intestinal obstruction, unspecified as to partial versus complete obstruction: Secondary | ICD-10-CM

## 2018-10-01 NOTE — Progress Notes (Signed)
  10/01/2018  History of Present Illness: Alexander Carpenter is a 53 y.o. 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  He was last seen on 12/18.  Since then he has been doing well, without any pain or significant issues.  Reports that he's doing well with the ileostomy and bag changes, and is doing well with the colostomy bag.  Reports infrequently having urgency for bowel movement per rectum with resultant mucous discharge.  Also reports minimal drainage from colostomy and sometimes does not need an appliance.  He does notice bulging around his end colostomy but this has not caused any problems.  Past Medical History: --Diverticulitis    Past Surgical History: Past Surgical History:  Procedure Laterality Date  . COLON SURGERY    . LAPAROTOMY N/A 06/24/2018   Procedure: EXPLORATORY LAPAROTOMY;  Surgeon: Henrene Dodge, MD;  Location: [facility] ORS;  Service: General;  Laterality: N/A;    Home Medications: Prior to Admission medications   None    Allergies: Allergies  Allergen Reactions  . Anesthesia S-I-40 [Propofol] Other (See Comments)    Blurry vision and cold sweats    Review of Systems: Review of Systems  Constitutional: Negative for chills and fever.  Respiratory: Negative for shortness of breath.   Cardiovascular: Negative for chest pain.  Gastrointestinal: Negative for abdominal pain, nausea and vomiting.    Physical Exam BP 139/86   Pulse 72   Temp 97.9 F (36.6 C) (Skin)   Ht 5\' 5"  (1.651 m)   Wt 168 lb (76.2 kg)   SpO2 98%   BMI 27.96 kg/m  CONSTITUTIONAL: No acute distress HEENT:  Normocephalic, atraumatic, extraocular motion intact. RESPIRATORY:  Lungs are clear, and breath sounds are equal bilaterally. Normal respiratory effort without pathologic use of accessory muscles. CARDIOVASCULAR: Heart is regular without murmurs, gallops, or rubs. GI: The abdomen is soft,  non-distended, non-tender to palpation.  Midline incision well healed without infection, breakdown, or hernia.  RLQ end ileostomy viable and pink with stool in bag.  LUQ end colostomy with low volume bowel sweat in bag, pink and viable, with parastomal hernia, nontender.  NEUROLOGIC:  Motor and sensation is grossly normal.  Cranial nerves are grossly intact. PSYCH:  Alert and oriented to person, place and time. Affect is normal.  Labs/Imaging: None  Assessment and Plan: This is a 53 y.o. male s/p sigmoidectomy, right colectomy, end ileostomy, end colostomy.  --It has been three months since patient's surgery.  He has healed well and is doing very well back to baseline.  --Discussed with him that next step is to check the remainder of his colon to make sure there are no masses/polyps or abnormalities.  This would require a colonoscopy to go per rectum and per end colostomy.  We will set up a referral to GI for this. --He will follow up with me after his colonoscopy to discuss results and to schedule ostomy reversals.    Face-to-face time spent with the patient and care providers was 25 minutes, with more than 50% of the time spent counseling, educating, and coordinating care of the patient.     Howie Ill, MD [facility]

## 2018-10-01 NOTE — Patient Instructions (Addendum)
RE TO gi for colonoscopy.The patient is aware to call back for any questions or concerns.

## 2018-10-11 ENCOUNTER — Encounter: Payer: Self-pay | Admitting: *Deleted

## 2018-10-12 ENCOUNTER — Telehealth: Payer: Self-pay | Admitting: Gastroenterology

## 2018-10-12 NOTE — Telephone Encounter (Signed)
Inez Pilgrim (brother)calling for patient to schedule colonoscopy. Needs interpeter

## 2018-10-13 NOTE — Telephone Encounter (Signed)
Alexander Carpenter has scheduled patient an office visit to discuss.  Thanks [employer]

## 2018-10-19 ENCOUNTER — Other Ambulatory Visit: Payer: Self-pay

## 2018-10-19 ENCOUNTER — Ambulatory Visit: Payer: Self-pay | Admitting: Gastroenterology

## 2018-10-19 VITALS — BP 107/68 | HR 85 | Ht 65.0 in | Wt 165.2 lb

## 2018-10-19 DIAGNOSIS — Z9889 Other specified postprocedural states: Secondary | ICD-10-CM

## 2018-10-19 DIAGNOSIS — K5732 Diverticulitis of large intestine without perforation or abscess without bleeding: Secondary | ICD-10-CM

## 2018-10-19 NOTE — Progress Notes (Signed)
Alexander Carpenter [address]  [address]  [place], [place] [address]  Main: [phone]  Fax: [phone]   Gastroenterology Consultation  Referring Provider:     Henrene Dodge, MD Primary Care Physician:  [facility] Reason for Consultation:     History of perforated diverticulitis        HPI:    Chief Complaint  Patient presents with  . Consult for colonoscopy  . History of diverticulitis    Alexander Carpenter is a 53 y.o. y/o male referred for consultation & management  by Dr. Avnet, [employer]  Patient was admitted to the hospital in November 2019 when he presented with 10-day history of abdominal pain and was found to have colon perforation and underwent emergency surgery.  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 Aleen Campi.  Patient denies any further abdominal pain.  The patient denies abdominal or flank pain, anorexia, nausea or vomiting, dysphagia, change in bowel habits or black or bloody stools or weight loss.  Patient has never had a colonoscopy.  No family history of colon cancer.  Past medical history: Diverticulitis  Past Surgical History:  Procedure Laterality Date  . COLON SURGERY    . LAPAROTOMY N/A 06/24/2018   Procedure: EXPLORATORY LAPAROTOMY;  Surgeon: Henrene Dodge, MD;  Location: [facility] ORS;  Service: General;  Laterality: N/A;    Prior to Admission medications   Not on File    No family history on file.   Social History   Tobacco Use  . Smoking status: Never Smoker  . Smokeless tobacco: Never Used  Substance Use Topics  . Alcohol use: Not on file  . Drug use: Not on file    Allergies as of 10/19/2018 - Review Complete 10/01/2018  Allergen Reaction Noted  . Anesthesia s-i-40 [propofol] Other (See Comments) 06/24/2018    Review of Systems:    All systems reviewed and negative except  where noted in HPI.   Physical Exam:  BP 107/68   Pulse 85   Ht 5\' 5"  (1.651 m)   Wt 165 lb 3.2 oz (74.9 kg)   BMI 27.49 kg/m  No LMP for male patient. Psych:  Alert and cooperative. Normal mood and affect. General:   Alert,  Well-developed, well-nourished, pleasant and cooperative in NAD Head:  Normocephalic and atraumatic. Eyes:  Sclera clear, no icterus.   Conjunctiva pink. Ears:  Normal auditory acuity. Nose:  No deformity, discharge, or lesions. Mouth:  No deformity or lesions,oropharynx pink & moist. Neck:  Supple; no masses or thyromegaly. Abdomen:  Normal bowel sounds.  No bruits.  Soft, non-tender and non-distended without masses, hepatosplenomegaly or hernias noted.  No guarding or rebound tenderness.  Ileostomy bag in place with clear yellow fluid.  Colostomy site with healthy pink stoma.  No bleeding noted. Msk:  Symmetrical without gross deformities. Good, equal movement & strength bilaterally. Pulses:  Normal pulses noted. Extremities:  No clubbing or edema.  No cyanosis. Neurologic:  Alert and oriented x3;  grossly normal neurologically. Skin:  Intact without significant lesions or rashes. No jaundice. Lymph Nodes:  No significant cervical adenopathy. Psych:  Alert and cooperative. Normal mood and affect.   Labs: CBC    Component Value Date/Time   WBC 7.1 06/29/2018 0606   RBC 4.48 06/29/2018 0606   HGB 12.5 (L) 06/29/2018 0606   HGB 12.4 (L) 04/16/2013 1639   HCT 38.7 (  L) 06/29/2018 0606   HCT 37.9 (L) 04/16/2013 1639   PLT 339 06/29/2018 0606   PLT 267 04/16/2013 1639   MCV 86.4 06/29/2018 0606   MCV 81 04/16/2013 1639   MCH 27.9 06/29/2018 0606   MCHC 32.3 06/29/2018 0606   RDW 14.1 06/29/2018 0606   RDW 16.6 (H) 04/16/2013 1639   CMP     Component Value Date/Time   NA 139 06/26/2018 1120   NA 140 04/16/2013 1639   K 4.0 06/26/2018 1120   K 3.5 04/16/2013 1639   CL 105 06/26/2018 1120   CL 109 (H) 04/16/2013 1639   CO2 28 06/26/2018 1120    CO2 23 04/16/2013 1639   GLUCOSE 181 (H) 06/26/2018 1120   GLUCOSE 137 (H) 04/16/2013 1639   BUN 25 (H) 06/26/2018 1120   BUN 15 04/16/2013 1639   CREATININE 1.06 06/26/2018 1120   CREATININE 0.90 04/16/2013 1639   CALCIUM 8.0 (L) 06/26/2018 1120   CALCIUM 8.2 (L) 04/16/2013 1639   PROT 7.4 06/24/2018 1117   PROT 7.4 04/16/2013 1639   ALBUMIN 3.9 06/24/2018 1117   ALBUMIN 3.7 04/16/2013 1639   AST 29 06/24/2018 1117   AST 27 04/16/2013 1639   ALT 44 06/24/2018 1117   ALT 29 04/16/2013 1639   ALKPHOS 79 06/24/2018 1117   ALKPHOS 78 04/16/2013 1639   BILITOT 2.1 (H) 06/24/2018 1117   BILITOT 0.2 04/16/2013 1639   GFRNONAA >60 06/26/2018 1120   GFRNONAA >60 04/16/2013 1639   GFRAA >60 06/26/2018 1120   GFRAA >60 04/16/2013 1639    Imaging Studies: No results found.  Assessment and Plan:   Alexander Carpenter is a 53 y.o. y/o male has been referred for history of diverticulitis  Dr. Aleen Campi is requesting colonoscopy prior to ileostomy takedown Patient has never had a colonoscopy, therefore it would be useful to rule out any underlying colon lesions or malignancy prior to his surgeries  I have discussed alternative options, risks & benefits,  which include, but are not limited to, bleeding, infection, perforation,respiratory complication & drug reaction.  The patient agrees with this plan & written consent will be obtained.    We will plan on intubating his colon from his rectum and evaluating the rectum and the entire colon.  We may also elect to briefly intubate his colostomy to evaluate that end of it as well.  I have asked Dr. Aleen Campi if he would like me to evaluate the ileostomy also, and he states he just needs the rectum and colon evaluated, therefore no need to evaluate ileostomy during the procedures.  Patient does not need an oral prep and we will plan on enema prep to clean out any mucus in the colon itself.  Dr Alexander Carpenter  Speech recognition software  was used to dictate the above note.

## 2018-10-26 ENCOUNTER — Telehealth: Payer: Self-pay | Admitting: Gastroenterology

## 2018-10-26 NOTE — Telephone Encounter (Signed)
Dr. Maximino Greenland,  Returned Annice Pih (Interpreter) she said patient was under the impression he was to have a colonoscopy on the 26th.  Explained to Annice Pih that we are not doing procedures, then she explained to me that this was a special case per Dr. Aleen Campi required for his ileostomy take down.  Annice Pih states the patient is experiencing some discomfort.  I see where you noted on 03/17 office visit plan to to intubating his colon from rectum and evaluating.  Please advise what needs to be done.  Thanks [employer]

## 2018-10-26 NOTE — Telephone Encounter (Signed)
Alexander Carpenter was working on finding out if his surgery has been rescheduled due to the reduced schedule. If his surgery has been rescheduled then we can schedule his colonoscopy accordingly. If not we may need to place him on the urgent list. Let me know

## 2018-10-26 NOTE — Telephone Encounter (Signed)
Patient called Annice Pih in the Interpreters office all confused. He though he had a colonoscopy 10-28-2018 & was calling for the time. Please call her to help decide when &  what patient needs before you go on vacation. Thank you

## 2018-10-27 ENCOUNTER — Telehealth: Payer: Self-pay

## 2018-10-27 NOTE — Progress Notes (Signed)
error 

## 2018-10-27 NOTE — Telephone Encounter (Signed)
Marcelino Duster, will you call Annice Pih back to have her call this pt to let him know all elective procedures have been cancelled. I have a message to [facility] to see when Dr. Aleen Campi will be scheduling him for his surgery. I'm pretty sure the ostomy reversal is an elective surgery so this may be put off as well. Once the restriction has been lifted and they schedule his surgery, we can do this scope 1 to 2 weeks before. Thanks!

## 2018-10-27 NOTE — Telephone Encounter (Signed)
Unable to contact patient see previous documentation.  Thanks [employer]

## 2018-10-27 NOTE — Telephone Encounter (Signed)
Contacted Copy on [place])  to let him know the following:   all elective procedures have been cancelled.We have a message to [facility] to see when Dr. Aleen Campi will be scheduling him for his surgery.the ostomy reversal is an elective surgery so this may be put off as well. Once the restriction has been lifted and they schedule his surgery, we can do this scope 1 to 2 weeks before.   We were unable to reach the patient due to voicemail box being full, and no voicemail on the other number.  Informed Kandis Cocking if patient happens to call her today to provide this message to him, and call me back if needed.  Thanks [employer]

## 2018-11-03 NOTE — Telephone Encounter (Signed)
See telephone message of 10/27/18 by MO.

## 2019-01-31 ENCOUNTER — Telehealth: Payer: Self-pay

## 2019-01-31 NOTE — Telephone Encounter (Signed)
-----   Message from Virgel Manifold, MD sent at 01/24/2019  9:48 AM EDT ----- Regarding: RE: colonoscopy Yes, absolutely will schedule.   Sharyn Lull or Jackelyn Poling, can you get him on the schedule for his procedure. He does not need an oral prep. Just fleet enemaX2 on the day of the procedure. Clear liquids the day before and NPO past midnight. Thank you ----- Message ----- From: Olean Ree, MD Sent: 01/21/2019  12:50 PM EDT To: Virgel Manifold, MD Subject: colonoscopy                                    Hi Dr. Bonna Gains,  [facility] you're doing well.  I was wondering now that the hospital is opened up again, would you be able to contact this patient to reschedule his colonoscopy?  I'll ask my office to contact him as well so we can set up a new follow up and try to schedule his surgery.  Thanks!  [employer]

## 2019-01-31 NOTE — Telephone Encounter (Signed)
Attempted to contact pt via [place] interpreter, Karlene Einstein [phone] but pt does not have voice mail set up. Will try again later.

## 2019-02-11 ENCOUNTER — Other Ambulatory Visit: Payer: Self-pay

## 2019-02-11 ENCOUNTER — Ambulatory Visit (INDEPENDENT_AMBULATORY_CARE_PROVIDER_SITE_OTHER): Payer: Self-pay | Admitting: Surgery

## 2019-02-11 ENCOUNTER — Encounter: Payer: Self-pay | Admitting: Surgery

## 2019-02-11 VITALS — BP 128/88 | HR 83 | Temp 97.9°F | Ht 65.0 in | Wt 162.0 lb

## 2019-02-11 DIAGNOSIS — K5732 Diverticulitis of large intestine without perforation or abscess without bleeding: Secondary | ICD-10-CM

## 2019-02-11 DIAGNOSIS — K3532 Acute appendicitis with perforation and localized peritonitis, without abscess: Secondary | ICD-10-CM

## 2019-02-11 MED ORDER — BISACODYL 5 MG PO TBEC: DELAYED_RELEASE_TABLET | ORAL | 0 refills | Status: DC

## 2019-02-11 MED ORDER — POLYETHYLENE GLYCOL 3350 17 GM/SCOOP PO POWD: ORAL | 0 refills | Status: DC

## 2019-02-11 MED ORDER — NEOMYCIN SULFATE 500 MG PO TABS: ORAL_TABLET | ORAL | 0 refills | Status: DC

## 2019-02-11 MED ORDER — ERYTHROMYCIN BASE 500 MG PO TABS: ORAL_TABLET | ORAL | 0 refills | Status: DC

## 2019-02-11 NOTE — Addendum Note (Signed)
Addended by: Riki Sheer on: 02/11/2019 12:28 PM   Modules accepted: Orders

## 2019-02-11 NOTE — Patient Instructions (Addendum)
Patient needs to have a colonoscopy first. Patient to call us when his colonoscopy is scheduled.   Patient's surgery to be scheduled for 03/10/19 at [facility] with Dr. Hampton Abbot, Dr Genevive Bi will be assisting.  The patient is aware he will need to Pre-Admit. This will be on 03/07/19. Patient will check in at the [place] entrance due to COVID-19 restrictions and will then be escorted to the [place], [address] (first floor). He will also have Covid-19 testing done that day. Patient will be contacted once Pre-admission appointment has been arranged with time.   Patient given instructions for bowel prep prior to surgery.   Patient aware to be NPO after midnight and have a driver.   He is aware to check in at the [place] entrance where he will be screened for the coronavirus and then sent to [facility].   Patient aware that he may have no visitors and driver will need to wait in the car due to COVID-19 restrictions.   The patient verbalizes understanding of the above.   The patient is aware to call the office should he have further questions.

## 2019-02-11 NOTE — Progress Notes (Signed)
02/11/2019  History of Present Illness: Alexander Carpenter is a 53 y.o. male presenting for follow-up of perforated right colon from an episode of severe diverticulitis.  His surgery was on 06/24/2018 and had a right colectomy and a sigmoidectomy with creation of an end ileostomy as well as an end colostomy.  We had tried to get his colonoscopy done prior to reversal of his ostomy is in March but due to COVID-19 his case was canceled.  He now presents for follow-up and to try to schedule surgery.  Looking at the chart, it appears that gastroenterology team try to contact him on 6/29 but his voicemail had not been set up and was not able to schedule.  Overall patient reports that he has been doing well eating well bowel movements with the end ileostomy.  The end colostomy has only mucus drainage.  He does report that he is getting bulging around the end colostomy now that had not been there before.  There is some soreness related to this.  Otherwise denies any fevers, chills, chest pain, shortness of breath, nausea, vomiting.  Past Medical History: No past medical history on file.   Past Surgical History: Past Surgical History:  Procedure Laterality Date  . COLON SURGERY    . LAPAROTOMY N/A 06/24/2018   Procedure: EXPLORATORY LAPAROTOMY;  Surgeon: Henrene DodgePiscoya, Anahita Cua, MD;  Location: [facility] ORS;  Service: General;  Laterality: N/A;    Home Medications: Prior to Admission medications   Not on File    Allergies: Allergies  Allergen Reactions  . Anesthesia S-I-40 [Propofol] Other (See Comments)    Blurry vision and cold sweats    Review of Systems: Review of Systems  Constitutional: Negative for chills and fever.  Respiratory: Negative for shortness of breath.   Cardiovascular: Negative for chest pain.  Gastrointestinal: Negative for abdominal pain, constipation, diarrhea, nausea and vomiting.    Physical Exam BP 128/88   Pulse 83   Temp 97.9 F (36.6 C)   Ht 5\' 5"  (1.651 m)    Wt 162 lb (73.5 kg)   SpO2 97%   BMI 26.96 kg/m  CONSTITUTIONAL: No acute distress HEENT:  Normocephalic, atraumatic, extraocular motion intact. RESPIRATORY:  Lungs are clear, and breath sounds are equal bilaterally. Normal respiratory effort without pathologic use of accessory muscles. CARDIOVASCULAR: Heart is regular without murmurs, gallops, or rubs. GI: The abdomen is soft, nondistended, currently with no tenderness to palpation.  Patient has a midline incision that is well-healed with no evidence of a hernia.  The end ileostomy in the right lower quadrant is patent with healthy mucosa and stool in the bag.  The left upper quadrant and colostomy has a parastomal hernia that is easily reducible with only some mild soreness on deep palpation.  The stoma itself is patent with pink viable tissue.   NEUROLOGIC:  Motor and sensation is grossly normal.  Cranial nerves are grossly intact. PSYCH:  Alert and oriented to person, place and time. Affect is normal.  Labs/Imaging: None recently  Assessment and Plan: This is a 53 y.o. male status post exploratory laparotomy with right colectomy and sigmoidectomy for perforated right colon from severe diverticulitis of the sigmoid colon.  Discussed with the patient and now the hospitals opening for normal business, we should be able to reschedule his surgery.  Right now her only pending the colonoscopy for GI.  Our interpreter here is being very gracious and will take the patient to the GI clinic so that he can set up an  appointment with Dr. Bonna Gains for colonoscopy.  Colonoscopy would be per rectum as well as per end colostomy.  Discussed with the patient that pending the colonoscopy we will tentatively schedule him for August 6 for reversal of his end ileostomy and end colostomy.  Discussed with him that if all the tissues look healthy and the surgery goes very well, we would make the 2 anastomosis with no diverting loop ileostomy.  However if there is any  concern whatsoever with the tissue or inflammation or healing, then we would do a temporary loop ileostomy in the right lower quadrant.  Most likely he would have drains afterwards around both anastomoses and will be in the hospital for a few days after surgery.  Discussed with him the risks of bleeding, infection, injury to surrounding structures.  He is willing to proceed and understands these risks and gives consent.  Face-to-face time spent with the patient and care providers was 25 minutes, with more than 50% of the time spent counseling, educating, and coordinating care of the patient.     [name], [facility]

## 2019-02-15 ENCOUNTER — Telehealth: Payer: Self-pay | Admitting: *Deleted

## 2019-02-15 NOTE — Telephone Encounter (Signed)
Call was made to the patient's cell phone with the help of Language [address] [place] [phone]. No answer x 2 and not able to leave a message.   We need to inform patient that his surgery has been scheduled for 03-10-19 with Dr. Hampton Abbot and Dr. Genevive Bi to assist.   Also, patient will need to pre-admit and have COVID testing done on 03-07-19 at 9 am.

## 2019-02-16 NOTE — Telephone Encounter (Signed)
Patient called in today with [name] interpreter, Alexander Carpenter.   The patient has been notified of Pre-admit appointment and COVID testing on 03-07-19 at 9 am.  Surgery on 03-10-19.   Patient is aware to call [facility] and get colonoscopy scheduled prior to surgery.

## 2019-02-16 NOTE — Telephone Encounter (Signed)
Paperwork mailed with Pre-admit appointment date and time.

## 2019-02-16 NOTE — Telephone Encounter (Signed)
Tried to reach patient again with the help [employer], [place] #[id number].  There was no answer x 1. Not able to leave a message.   Tried again and someone picked up but immediatley hung up.   Tried again and no answer. Not able to leave a message.   Also, tried sister but no answer and not able to leave a message.

## 2019-02-16 NOTE — Telephone Encounter (Signed)
PT is calling with interpreter to schedule a colonoscopy.

## 2019-02-17 ENCOUNTER — Telehealth: Payer: Self-pay | Admitting: *Deleted

## 2019-02-17 NOTE — Telephone Encounter (Signed)
Patient called the office today wanting to get colonoscopy arranged.  I did call [employer] and was assisted by [employer], Felix Ahmadi [phone].  The patient reports that he did not call [facility] yesterday.   Patient was again given the number to [facility] to call and get his colonoscopy arranged. He was reminded that he needs to have this done before surgery with Dr. Hampton Abbot scheduled for 03-10-19.   The patient was also reminded about Pre-admission Appointment scheduled for 03-07-19 at 9 am.  He verbalizes understanding.

## 2019-02-22 ENCOUNTER — Telehealth: Payer: Self-pay | Admitting: Gastroenterology

## 2019-02-22 NOTE — Telephone Encounter (Signed)
patient saw dr Bonna Gains back in march 2020. he needs to be scheduled for a colonoscopy prior to 03-10-19 (this is when he is scheduled for ileostomy and colostomy reversal with dr Hampton Abbot. can you forward this to whoever can help get this arranged? Thanks.

## 2019-02-23 ENCOUNTER — Other Ambulatory Visit: Payer: Self-pay

## 2019-02-23 ENCOUNTER — Telehealth: Payer: Self-pay

## 2019-02-23 DIAGNOSIS — K5732 Diverticulitis of large intestine without perforation or abscess without bleeding: Secondary | ICD-10-CM

## 2019-02-23 NOTE — Telephone Encounter (Signed)
Colonoscopy scheduled with Dr. Bonna Gains on 03/01/19 at [facility].  This is for his ileostomy takedown with Dr. Hampton Abbot on 08/06.  Informed patient that this will be with an enema bowel prep.  Are there any specific instructions for the enema bowel prep that he needs to be aware of?  Please advise.  Thanks [employer]

## 2019-02-23 NOTE — Telephone Encounter (Signed)
Colonoscopy scheduled at [facility] on 03/01/19 with Dr. Bonna Gains.  Thanks [employer]

## 2019-02-24 ENCOUNTER — Telehealth: Payer: Self-pay

## 2019-02-24 ENCOUNTER — Other Ambulatory Visit: Payer: Self-pay

## 2019-02-24 ENCOUNTER — Other Ambulatory Visit
Admission: RE | Admit: 2019-02-24 | Discharge: 2019-02-24 | Disposition: A | Discharge status: Home / Self Care | Payer: Self-pay | Source: Ambulatory Visit | Attending: Gastroenterology | Admitting: Gastroenterology

## 2019-02-24 DIAGNOSIS — Z1159 Encounter for screening for other viral diseases: Secondary | ICD-10-CM | POA: Insufficient documentation

## 2019-02-24 LAB — SARS CORONAVIRUS 2 (TAT 6-24 HRS): SARS Coronavirus 2: NEGATIVE

## 2019-02-24 NOTE — Telephone Encounter (Signed)
Patient has been informed the following instructions in preparation for his colonoscopy at [facility] with Dr. Bonna Gains on 03/01/19:  1-2 hours before colonoscopy begin fleets enema administering each 30 mins apart.  Two fleets enemas have been called to [employer].  [facility] will contact patient when prescription is ready.  Patient has been advised nothing to eat or drink after midnight prior to the day of the procedure.  Nurse will call to let him know what time he will need to arrive for procedure.  COVID test performed today.  Thanks [employer]

## 2019-03-01 ENCOUNTER — Ambulatory Visit: Payer: Self-pay | Admitting: Anesthesiology

## 2019-03-01 ENCOUNTER — Ambulatory Visit
Admission: RE | Admit: 2019-03-01 | Discharge: 2019-03-01 | Disposition: A | Discharge status: Home / Self Care | Payer: Self-pay | Attending: Gastroenterology | Admitting: Gastroenterology

## 2019-03-01 ENCOUNTER — Other Ambulatory Visit: Payer: Self-pay

## 2019-03-01 ENCOUNTER — Encounter
Admission: RE | Disposition: A | Discharge status: Home / Self Care | Payer: Self-pay | Source: Home / Self Care | Attending: Gastroenterology

## 2019-03-01 DIAGNOSIS — Z9049 Acquired absence of other specified parts of digestive tract: Secondary | ICD-10-CM | POA: Insufficient documentation

## 2019-03-01 DIAGNOSIS — K529 Noninfective gastroenteritis and colitis, unspecified: Secondary | ICD-10-CM | POA: Insufficient documentation

## 2019-03-01 DIAGNOSIS — Z01818 Encounter for other preprocedural examination: Secondary | ICD-10-CM

## 2019-03-01 DIAGNOSIS — K5730 Diverticulosis of large intestine without perforation or abscess without bleeding: Secondary | ICD-10-CM

## 2019-03-01 DIAGNOSIS — K6389 Other specified diseases of intestine: Secondary | ICD-10-CM

## 2019-03-01 DIAGNOSIS — Z933 Colostomy status: Secondary | ICD-10-CM

## 2019-03-01 HISTORY — PX: COLONOSCOPY WITH PROPOFOL: SHX5780

## 2019-03-01 SURGERY — COLONOSCOPY WITH PROPOFOL
Anesthesia: General | Site: Rectum

## 2019-03-01 MED ORDER — LACTATED RINGERS IV SOLN
INTRAVENOUS | Status: DC
  Administered 2019-03-01: 08:00:00 via INTRAVENOUS

## 2019-03-01 MED ORDER — PROPOFOL 10 MG/ML IV BOLUS
INTRAVENOUS | Status: DC | PRN
  Administered 2019-03-01: 140 mg via INTRAVENOUS
  Administered 2019-03-01 (×5): 30 mg via INTRAVENOUS
  Administered 2019-03-01: 20 mg via INTRAVENOUS
  Administered 2019-03-01: 30 mg via INTRAVENOUS

## 2019-03-01 MED ORDER — LIDOCAINE HCL (CARDIAC) PF 100 MG/5ML IV SOSY
PREFILLED_SYRINGE | INTRAVENOUS | Status: DC | PRN
  Administered 2019-03-01: 30 mg via INTRAVENOUS

## 2019-03-01 MED ORDER — STERILE WATER FOR IRRIGATION IR SOLN
Status: DC | PRN
  Administered 2019-03-01: 200 mL

## 2019-03-01 SURGICAL SUPPLY — 24 items
CANISTER SUCT 1200ML W/VALVE (MISCELLANEOUS) ×3 IMPLANT
CLIP HMST 235XBRD CATH ROT (MISCELLANEOUS) IMPLANT
CLIP RESOLUTION 360 11X235 (MISCELLANEOUS)
ELECT REM PT RETURN 9FT ADLT (ELECTROSURGICAL)
ELECTRODE REM PT RTRN 9FT ADLT (ELECTROSURGICAL) IMPLANT
FCP ESCP3.2XJMB 240X2.8X (MISCELLANEOUS)
FORCEPS BIOP RAD 4 LRG CAP 4 (CUTTING FORCEPS) ×2 IMPLANT
FORCEPS BIOP RJ4 240 W/NDL (MISCELLANEOUS)
FORCEPS ESCP3.2XJMB 240X2.8X (MISCELLANEOUS) IMPLANT
GOWN CVR UNV OPN BCK APRN NK (MISCELLANEOUS) ×2 IMPLANT
GOWN ISOL THUMB LOOP REG UNIV (MISCELLANEOUS) ×4
INJECTOR VARIJECT VIN23 (MISCELLANEOUS) IMPLANT
KIT DEFENDO VALVE AND CONN (KITS) IMPLANT
KIT ENDO PROCEDURE OLY (KITS) ×3 IMPLANT
MARKER SPOT ENDO TATTOO 5ML (MISCELLANEOUS) IMPLANT
PROBE APC STR FIRE (PROBE) IMPLANT
RETRIEVER NET ROTH 2.5X230 LF (MISCELLANEOUS) IMPLANT
SNARE SHORT THROW 13M SML OVAL (MISCELLANEOUS) IMPLANT
SNARE SHORT THROW 30M LRG OVAL (MISCELLANEOUS) IMPLANT
SNARE SNG USE RND 15MM (INSTRUMENTS) IMPLANT
SPOT EX ENDOSCOPIC TATTOO (MISCELLANEOUS)
TRAP ETRAP POLY (MISCELLANEOUS) IMPLANT
VARIJECT INJECTOR VIN23 (MISCELLANEOUS)
WATER STERILE IRR 250ML POUR (IV SOLUTION) ×3 IMPLANT

## 2019-03-01 NOTE — Transfer of Care (Signed)
Immediate Anesthesia Transfer of Care Note  Patient: Alexander Carpenter  Procedure(s) Performed: COLONOSCOPY WITH PROPOFOL (N/A Rectum)  Patient Location: PACU  Anesthesia Type: General  Level of Consciousness: awake, alert  and patient cooperative  Airway and Oxygen Therapy: Patient Spontanous Breathing and Patient connected to supplemental oxygen  Post-op Assessment: Post-op Vital signs reviewed, Patient's Cardiovascular Status Stable, Respiratory Function Stable, Patent Airway and No signs of Nausea or vomiting  Post-op Vital Signs: Reviewed and stable  Complications: No apparent anesthesia complications

## 2019-03-01 NOTE — Anesthesia Preprocedure Evaluation (Signed)
Anesthesia Evaluation  Patient identified by MRN, date of birth, ID band Patient awake    Reviewed: Allergy & Precautions, H&P , NPO status , Patient's Chart, lab work & pertinent test results  Airway Mallampati: II  TM Distance: >3 FB Neck ROM: full    Dental no notable dental hx.    Pulmonary    Pulmonary exam normal breath sounds clear to auscultation       Cardiovascular Normal cardiovascular exam Rhythm:regular Rate:Normal     Neuro/Psych    GI/Hepatic   Endo/Other    Renal/GU      Musculoskeletal   Abdominal   Peds  Hematology   Anesthesia Other Findings   Reproductive/Obstetrics                             Anesthesia Physical Anesthesia Plan  ASA: II  Anesthesia Plan: General   Post-op Pain Management:    Induction: Intravenous  PONV Risk Score and Plan: 2 and Propofol infusion and Treatment may vary due to age or medical condition  Airway Management Planned: Natural Airway  Additional Equipment:   Intra-op Plan:   Post-operative Plan:   Informed Consent: I have reviewed the patients History and Physical, chart, labs and discussed the procedure including the risks, benefits and alternatives for the proposed anesthesia with the patient or authorized representative who has indicated his/her understanding and acceptance.     Plan Discussed with: CRNA  Anesthesia Plan Comments:         Anesthesia Quick Evaluation  

## 2019-03-01 NOTE — Telephone Encounter (Signed)
Having procedure today.

## 2019-03-01 NOTE — Anesthesia Procedure Notes (Signed)
Date/Time: 03/01/2019 9:07 AM Performed by: Cameron Ali, CRNA Pre-anesthesia Checklist: Patient identified, Emergency Drugs available, Suction available, Timeout performed and Patient being monitored Patient Re-evaluated:Patient Re-evaluated prior to induction Oxygen Delivery Method: Nasal cannula Placement Confirmation: positive ETCO2

## 2019-03-01 NOTE — Anesthesia Postprocedure Evaluation (Signed)
Anesthesia Post Note  Patient: Alexander Carpenter  Procedure(s) Performed: COLONOSCOPY WITH PROPOFOL (N/A Rectum)  Patient location during evaluation: PACU Anesthesia Type: General Level of consciousness: awake and alert and oriented Pain management: satisfactory to patient Vital Signs Assessment: post-procedure vital signs reviewed and stable Respiratory status: spontaneous breathing, nonlabored ventilation and respiratory function stable Cardiovascular status: blood pressure returned to baseline and stable Postop Assessment: Adequate PO intake and No signs of nausea or vomiting Anesthetic complications: no    Raliegh Ip

## 2019-03-01 NOTE — H&P (Signed)
Vonda Antigua, MD [address], [place], [place], [place], [address] [address], [place], [place], [place], [address] Phone: [phone]  Fax: [phone]  Primary Care Physician:  [facility]   Pre-Procedure History & Physical: HPI:  Alexander Carpenter is a 53 y.o. male is here for a colonoscopy.   History reviewed. No pertinent past medical history.  Past Surgical History:  Procedure Laterality Date  . COLON SURGERY    . LAPAROTOMY N/A 06/24/2018   Procedure: EXPLORATORY LAPAROTOMY;  Surgeon: Olean Ree, MD;  Location: [facility] ORS;  Service: General;  Laterality: N/A;    Prior to Admission medications   Medication Sig Start Date End Date Taking? Authorizing Provider  bisacodyl (DULCOLAX) 5 MG EC tablet Take all 4 tablets at 8 am the morning prior to your surgery. 02/11/19   Olean Ree, MD  erythromycin base (E-MYCIN) 500 MG tablet Take 2 tablets at 8am, 2 tablets at 2pm, and 2 tablets at 8pm the day prior to surgery. 02/11/19   Olean Ree, MD  neomycin (MYCIFRADIN) 500 MG tablet Take 2 tablet at 8am, take 2 tablets at 2pm, and take 2 tablets at 8pm the day prior to your surgery 02/11/19   Olean Ree, MD  polyethylene glycol powder (GLYCOLAX/MIRALAX) 17 GM/SCOOP powder Mix whole container with 64 ounces of Gatorade, NO RED 02/11/19   Olean Ree, MD    Allergies as of 02/23/2019 - Review Complete 02/11/2019  Allergen Reaction Noted  . Anesthesia s-i-40 [propofol] Other (See Comments) 06/24/2018    History reviewed. No pertinent family history.  Social History   Socioeconomic History  . Marital status: Single    Spouse name: Not on file  . Number of children: Not on file  . Years of education: Not on file  . Highest education level: Not on file  Occupational History  . Not on file  Social Needs  . Financial resource strain: Not on file  . Food insecurity    Worry: Not on file    Inability: Not on file  . Transportation  needs    Medical: Not on file    Non-medical: Not on file  Tobacco Use  . Smoking status: Never Smoker  . Smokeless tobacco: Never Used  Substance and Sexual Activity  . Alcohol use: Not on file  . Drug use: Not on file  . Sexual activity: Not on file  Lifestyle  . Physical activity    Days per week: Not on file    Minutes per session: Not on file  . Stress: Not on file  Relationships  . Social Herbalist on phone: Not on file    Gets together: Not on file    Attends religious service: Not on file    Active member of club or organization: Not on file    Attends meetings of clubs or organizations: Not on file    Relationship status: Not on file  . Intimate partner violence    Fear of current or ex partner: Not on file    Emotionally abused: Not on file    Physically abused: Not on file    Forced sexual activity: Not on file  Other Topics Concern  . Not on file  Social History Narrative  . Not on file    Review of Systems: See HPI, otherwise negative ROS  Physical Exam: BP (!) 115/95   Pulse 98   Temp (!) 97.2 F (36.2 C) (Temporal)   Resp 18  Ht 5\' 3"  (1.6 m)   Wt 70.3 kg   SpO2 98%   BMI 27.46 kg/m  General:   Alert,  pleasant and cooperative in NAD Head:  Normocephalic and atraumatic. Neck:  Supple; no masses or thyromegaly. Lungs:  Clear throughout to auscultation, normal respiratory effort.    Heart:  +S1, +S2, Regular rate and rhythm, No edema. Abdomen:  Soft, nontender and nondistended. Normal bowel sounds, without guarding, and without rebound.   Neurologic:  Alert and  oriented x4;  grossly normal neurologically.  Impression/Plan: Alexander Carpenter is here for a colonoscopy to be performed for average risk screening. Examination prior to ileostomy reversal  Risks, benefits, limitations, and alternatives regarding  colonoscopy have been reviewed with the patient.  Questions have been answered.  All parties agreeable.   Pasty SpillersVarnita B  Bailey Kolbe, MD  03/01/2019, 9:00 AM

## 2019-03-01 NOTE — Op Note (Signed)
[facility] Gastroenterology Patient Name: Alexander Carpenter Procedure Date: 03/01/2019 9:01 AM MRN: [id number] Account #: [id number] Date of Birth: 07-12-1966 Admit Type: Outpatient Age: 53 Room: [facility] OR ROOM 01 Gender: Male Note Status: Finalized Procedure:            Colonoscopy via Stoma with Endoscopy of Hartmann Pouch Indications:          History of partial colectomy, Preoperative assessment                        for colostomy take-down, Preoperative assessment for                        reversal of Hartmann pouch, History of diverticulitis Providers:            Sheryll Dymek B. Bonna Gains MD, MD Referring MD:         Jules Husbands (Referring MD) Medicines:            Monitored Anesthesia Care Complications:        No immediate complications. Procedure:            Pre-Anesthesia Assessment:                       - Prior to the procedure, a History and Physical was                        performed, and patient medications and allergies were                        reviewed. The patient is competent. The risks and                        benefits of the procedure and the sedation options and                        risks were discussed with the patient. All questions                        were answered and informed consent was obtained.                        Patient identification and proposed procedure were                        verified by the physician, the nurse, the                        anesthesiologist, the anesthetist and the technician in                        the pre-procedure area in the procedure room in the                        endoscopy suite. Mental Status Examination: alert and                        oriented. Airway Examination: normal oropharyngeal                        airway and neck mobility. Respiratory Examination:  clear to auscultation. CV Examination: normal.                        Prophylactic  Antibiotics: The patient does not require                        prophylactic antibiotics. Prior Anticoagulants: The                        patient has taken no previous anticoagulant or                        antiplatelet agents. ASA Grade Assessment: II - A                        patient with mild systemic disease. After reviewing the                        risks and benefits, the patient was deemed in                        satisfactory condition to undergo the procedure. The                        anesthesia plan was to use monitored anesthesia care                        (MAC). Immediately prior to administration of                        medications, the patient was re-assessed for adequacy                        to receive sedatives. The heart rate, respiratory rate,                        oxygen saturations, blood pressure, adequacy of                        pulmonary ventilation, and response to care were                        monitored throughout the procedure. The physical status                        of the patient was re-assessed after the procedure.                       After obtaining informed consent, the endoscope was                        passed under direct vision. Throughout the procedure,                        the patient's blood pressure, pulse, and oxygen                        saturations were monitored continuously. The was  introduced through the anus and advanced to the the                        [facility] pouch. The quality of the bowel preparation                        was good. Findings:      Patient is status-post partial colectomy with a surgical anastomosis.      A patchy area of mildly vascular-pattern-decreased mucosa was found in       the [employer]. Biopsies were taken with a cold forceps for       histology.      There was evidence of a widely patent end colostomy in the entire colon.       This was characterized by  healthy appearing mucosa.      Normal mucosa was found in the entire colon. Mucus balls were seen in       the colon.      A few small-mouthed diverticula were found in the entire colon. Impression:           - Vascular-pattern-decreased mucosa in the [facility]                        pouch. Biopsied.                       - Widely patent end colostomy with healthy appearing                        mucosa in the entire examined colon.                       - Normal mucosa in the entire examined colon.                       - Diverticulosis in the entire examined colon.                       - No large lesions were evident. Due to presence of                        mucus balls and altered vascularity in the hartmann's                        pouch small polyps or flat lesions may be present that                        were not seen. Recommendation:       - - Repeat post-surgical lower GI endoscopy in 6 months                        (post surgery) for screening purposes.                       - Await pathology results.                       - Continue present medications.                       - Return to primary care physician in  4 weeks.                       - Return to Dr. Hampton Abbot in 2 weeks.                       - The findings and recommendations were discussed with                        the patient.                       - The findings and recommendations were discussed with                        the patient's family. 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 All rights reserved. The codes documented in this report are preliminary and upon coder review may  be revised to meet current compliance requirements. Attending Participation:      I personally performed the entire procedure.  Vonda Antigua, MD Margretta Sidle B. Bonna Gains MD, MD 03/01/2019 9:40:36 AM This report has been signed electronically. Number of Addenda: 0 Note Initiated On: 03/01/2019 9:01 AM Total Procedure Duration: 0 hours 15 minutes 22 seconds  Estimated Blood Loss: Estimated  blood loss: none.      [facility]

## 2019-03-02 ENCOUNTER — Encounter: Payer: Self-pay | Admitting: Gastroenterology

## 2019-03-07 ENCOUNTER — Other Ambulatory Visit: Payer: Self-pay

## 2019-03-07 ENCOUNTER — Encounter
Admission: RE | Admit: 2019-03-07 | Discharge: 2019-03-07 | Disposition: A | Discharge status: Home / Self Care | Payer: Self-pay | Source: Ambulatory Visit | Attending: Surgery | Admitting: Surgery

## 2019-03-07 DIAGNOSIS — Z20828 Contact with and (suspected) exposure to other viral communicable diseases: Secondary | ICD-10-CM | POA: Insufficient documentation

## 2019-03-07 DIAGNOSIS — Z01812 Encounter for preprocedural laboratory examination: Secondary | ICD-10-CM | POA: Insufficient documentation

## 2019-03-07 HISTORY — DX: Gastritis, unspecified, without bleeding: K29.70

## 2019-03-07 LAB — CBC
HCT: 43.9 % (ref 39.0–52.0)
Hemoglobin: 14.3 g/dL (ref 13.0–17.0)
MCH: 29.5 pg (ref 26.0–34.0)
MCHC: 32.6 g/dL (ref 30.0–36.0)
MCV: 90.7 fL (ref 80.0–100.0)
Platelets: 277 10*3/uL (ref 150–400)
RBC: 4.84 MIL/uL (ref 4.22–5.81)
RDW: 13.2 % (ref 11.5–15.5)
WBC: 5.4 10*3/uL (ref 4.0–10.5)
nRBC: 0 % (ref 0.0–0.2)

## 2019-03-07 LAB — BASIC METABOLIC PANEL
Anion gap: 11 (ref 5–15)
BUN: 8 mg/dL (ref 6–20)
CO2: 23 mmol/L (ref 22–32)
Calcium: 8.5 mg/dL — ABNORMAL LOW (ref 8.9–10.3)
Chloride: 104 mmol/L (ref 98–111)
Creatinine, Ser: 0.91 mg/dL (ref 0.61–1.24)
GFR calc Af Amer: >60 mL/min (ref >60)
GFR calc non Af Amer: >60 mL/min (ref >60)
Glucose, Bld: 366 mg/dL — ABNORMAL HIGH (ref 70–99)
Potassium: 4.1 mmol/L (ref 3.5–5.1)
Sodium: 138 mmol/L (ref 135–145)

## 2019-03-07 LAB — SARS CORONAVIRUS 2 (TAT 6-24 HRS): SARS Coronavirus 2: NEGATIVE

## 2019-03-07 NOTE — Patient Instructions (Signed)
Your procedure is scheduled on: Thurs 03/10/19 Su procedimiento est programado para: Report to [facility]: To find out your arrival time please call [phone] between 1PM - 3PM on Wed 03/09/19. Para saber su hora de llegada por favor llame al [phone] entre la 1PM - 3PM el da:  Remember: Instructions that are not followed completely may result in serious medical risk, up to and including death, or upon the discretion of your surgeon and anesthesiologist your surgery may need to be rescheduled.  Recuerde: Las instrucciones que no se siguen completamente Armed forces logistics/support/administrative officerpueden resultar en un riesgo de salud grave, incluyendo hasta la [place]erte o a discrecin de su cirujano y Scientific laboratory techniciananestesilogo, su ciruga se puede posponer.   __X__ 1. Do not eat food or drink liquids after midnight. No gum chewing or hard candies.  No coma alimentos ni tome lquidos despus de la medianoche.  No mastique chicle ni caramelos  duros.     __X__ 2. No alcohol for 24 hours before or after surgery.    No tome alcohol durante las 24 horas antes ni despus de la [place].   ____ 3. Bring all medications with you on the day of surgery if instructed.    Lleve todos los medicamentos con usted el da de su ciruga si se le ha indicado as.   __X__ 4. Notify your doctor if there is any change in your medical condition (cold, fever,                             infections).    Informe a su mdico si hay algn cambio en su condicin mdica (resfriado, fiebre, infecciones).   Do not wear jewelry, make-up, hairpins, clips or nail polish.  No use joyas, maquillajes, pinzas/ganchos para el cabello ni esmalte de uas.  Do not wear lotions, powders, or perfumes. .  No use lociones, polvos o perfumes.  .    Do not shave 48 hours prior to surgery. Men may shave face and neck.  No se afeite 48 horas antes de la [place].  Los hombres pueden Commercial Metals Companyafeitarse la cara y el cuello.   Do not bring valuables to the hospital.   No lleve objetos  de valor al hospital.  [facility] is not responsible for any belongings or valuables.   no se hace responsable de ningn tipo de pertenencias u objetos de Licensed conveyancervalor.               Contacts, dentures or bridgework may not be worn into surgery.  Los lentes de [place]contacto, las dentaduras postizas o puentes no se pueden usar en la [place].  Leave your suitcase in the car. After surgery it may be brought to your room.  Deje su maleta en el auto.  Despus de la ciruga podr traerla a su habitacin.  For patients admitted to the hospital, discharge time is determined by your treatment team.  Para los pacientes que sean ingresados al hospital, el tiempo en el cual se le dar de alta es determinado por su                equipo de Geneseetratamiento.   Patients discharged the day of surgery will not be allowed to drive home. A los pacientes que se les da de alta el mismo da de la ciruga no se les permitir conducir a Higher education careers advisercasa.   Please read over the following fact sheets that you were given: Por favor  lea las siguientes hojas de informacin que le dieron:     ____ Take these medicines the morning of surgery with A SIP OF WATER:          Tome estas medicinas la maana de la ciruga con [place]:  1.   2.   3.   4.       5.  6.  __X__ Fleet Enema (as directed)          Enema de Fleet (segn lo indicado)    __X__ Use CHG Soap as directed          Utilice el jabn de CHG segn lo indicado  ____ Use inhalers on the day of surgery          Use los inhaladores el da de la ciruga  ____ Stop metformin 2 days prior to surgery          Deje de tomar el metformin 2 das antes de la ciruga    ____ Take 1/2 of usual insulin dose the night before surgery and none on the morning of surgery           Tome la mitad de la dosis habitual de insulina la noche antes de la [place] y no tome nada en la maana de la             ciruga  ____ Stop Coumadin/Plavix/aspirin on           Deje de tomar  el Coumadin/Plavix/aspirina el da:  ____ Stop Anti-inflammatories on           Deje de tomar antiinflamatorios el da:   ____ Stop supplements until after surgery            Deje de tomar suplementos hasta despus de la ciruga  ____ Bring C-Pap to the hospital          [facility] hospital

## 2019-03-08 ENCOUNTER — Telehealth: Payer: Self-pay | Admitting: *Deleted

## 2019-03-08 NOTE — Pre-Procedure Instructions (Signed)
REQUEST FOR CLEARANCE RE BS 366 FAXED TO [place]. HAD TO L,M [place] RECORDS/ REFERRAL DEPT

## 2019-03-08 NOTE — Pre-Procedure Instructions (Signed)
ALSO FAXED CLEARANCE REQUEST INFO TO [place] AT [facility] [place] AND ASKED THAT SHE HELP WITH FOLLOWUOP SINCE I HAD TO [place] RECORDS/REFERRAL DEPT

## 2019-03-08 NOTE — Telephone Encounter (Signed)
Per Judeen Hammans with Anesthesia, patient's glucose was 366 yesterday.   They have forwarded a medical clearance request to [facility] (Phone: [phone]: [phone]).   I did call and speak with Kendrick Fries at their office. She is to check to confirm that they received medical clearance request and then forward to the nurse/MD for review and then someone will get back with our office.   Dr. Hampton Abbot notified of the above.

## 2019-03-08 NOTE — Telephone Encounter (Signed)
Patient was contacted today with the help of [address] Rennis Chris ID# [id number].  The patient is aware that glucose was high on recent labs and he will need to have medical clearance prior to surgery.   Per Tiffany at [facility], patient has only been seen at their office once and patient will need to come in and be seen for an appointment. An appointment has been scheduled for the patient to see Hendricks Milo, NP on 03-11-19 at 10 am. Patient aware.   Surgery to be cancelled.   Dr. Hampton Abbot notified.

## 2019-03-10 ENCOUNTER — Encounter: Admission: RE | Payer: Self-pay | Source: Home / Self Care

## 2019-03-10 ENCOUNTER — Encounter: Payer: Self-pay | Admitting: Gastroenterology

## 2019-03-10 ENCOUNTER — Ambulatory Visit: Admission: RE | Admit: 2019-03-10 | Payer: Self-pay | Source: Home / Self Care | Admitting: Surgery

## 2019-03-10 SURGERY — CLOSURE, ILEOSTOMY
Anesthesia: General

## 2019-03-14 ENCOUNTER — Telehealth: Payer: Self-pay | Admitting: *Deleted

## 2019-03-14 NOTE — Telephone Encounter (Signed)
I called and left a message for [facility] inquiring about patient's office visit on 03-11-19.   Patient was to be seen for medical clearance and I just need to check on the status of this request.

## 2019-03-16 ENCOUNTER — Encounter: Payer: Self-pay | Admitting: *Deleted

## 2019-03-16 NOTE — Progress Notes (Signed)
I did call and speak with Kendrick Fries at [facility] regarding medical clearance request. She does not see any follow up appointments scheduled for this patient.   She will send a note to the nurse to call our office in regards to the above.

## 2019-03-23 NOTE — Telephone Encounter (Signed)
Per Tim Lair at [facility] today, the patient was seen at [facility] not at [place].   I did call to [facility] and they state the patient is scheduled for 04-05-19 for a follow up appointment; however, provider wants to move the appointment out till mid September to give enough time for the patient's medication to work.

## 2019-04-06 ENCOUNTER — Telehealth: Payer: Self-pay | Admitting: Surgery

## 2019-04-06 NOTE — Telephone Encounter (Signed)
Per Karen Kays, CMA- Patient originally had an appointment with [facility] on 04/05/19, however the PCP moved the appointment out till September to give enough time for the patient's medication to take effect for diabetes.   I will call [facility] and follow up on the new date of appointment as well as contact patient to follow up with [place] based off of the clearance once obtained.   Colonoscopy was completed on 03/01/19.

## 2019-04-07 ENCOUNTER — Encounter: Payer: Self-pay | Admitting: *Deleted

## 2019-04-07 ENCOUNTER — Other Ambulatory Visit: Payer: Self-pay

## 2019-04-07 ENCOUNTER — Emergency Department: Payer: Self-pay

## 2019-04-07 ENCOUNTER — Emergency Department
Admission: EM | Admit: 2019-04-07 | Discharge: 2019-04-08 | Disposition: A | Discharge status: Home / Self Care | Payer: Self-pay | Attending: Student | Admitting: Student

## 2019-04-07 DIAGNOSIS — E119 Type 2 diabetes mellitus without complications: Secondary | ICD-10-CM | POA: Insufficient documentation

## 2019-04-07 DIAGNOSIS — R0602 Shortness of breath: Secondary | ICD-10-CM | POA: Insufficient documentation

## 2019-04-07 DIAGNOSIS — Z932 Ileostomy status: Secondary | ICD-10-CM | POA: Insufficient documentation

## 2019-04-07 DIAGNOSIS — N179 Acute kidney failure, unspecified: Secondary | ICD-10-CM | POA: Insufficient documentation

## 2019-04-07 DIAGNOSIS — R079 Chest pain, unspecified: Secondary | ICD-10-CM | POA: Insufficient documentation

## 2019-04-07 DIAGNOSIS — R1013 Epigastric pain: Secondary | ICD-10-CM | POA: Insufficient documentation

## 2019-04-07 DIAGNOSIS — Z9049 Acquired absence of other specified parts of digestive tract: Secondary | ICD-10-CM | POA: Insufficient documentation

## 2019-04-07 DIAGNOSIS — E785 Hyperlipidemia, unspecified: Secondary | ICD-10-CM | POA: Insufficient documentation

## 2019-04-07 DIAGNOSIS — R112 Nausea with vomiting, unspecified: Secondary | ICD-10-CM | POA: Insufficient documentation

## 2019-04-07 LAB — COMPREHENSIVE METABOLIC PANEL
ALT: 108 U/L — ABNORMAL HIGH (ref 0–44)
AST: 55 U/L — ABNORMAL HIGH (ref 15–41)
Albumin: 4.8 g/dL (ref 3.5–5.0)
Alkaline Phosphatase: 87 U/L (ref 38–126)
Anion gap: 19 — ABNORMAL HIGH (ref 5–15)
BUN: 21 mg/dL — ABNORMAL HIGH (ref 6–20)
CO2: 16 mmol/L — ABNORMAL LOW (ref 22–32)
Calcium: 9.7 mg/dL (ref 8.9–10.3)
Chloride: 100 mmol/L (ref 98–111)
Creatinine, Ser: 1.44 mg/dL — ABNORMAL HIGH (ref 0.61–1.24)
GFR calc Af Amer: >60 mL/min (ref >60)
GFR calc non Af Amer: 55 mL/min — ABNORMAL LOW (ref >60)
Glucose, Bld: 222 mg/dL — ABNORMAL HIGH (ref 70–99)
Potassium: 4 mmol/L (ref 3.5–5.1)
Sodium: 135 mmol/L (ref 135–145)
Total Bilirubin: 1.6 mg/dL — ABNORMAL HIGH (ref 0.3–1.2)
Total Protein: 8.3 g/dL — ABNORMAL HIGH (ref 6.5–8.1)

## 2019-04-07 LAB — URINALYSIS, COMPLETE (UACMP) WITH MICROSCOPIC
Bacteria, UA: NONE SEEN
Bilirubin Urine: NEGATIVE
Glucose, UA: NEGATIVE mg/dL
Hgb urine dipstick: NEGATIVE
Ketones, ur: 20 mg/dL — AB
Leukocytes,Ua: NEGATIVE
Nitrite: NEGATIVE
Protein, ur: NEGATIVE mg/dL
Specific Gravity, Urine: 1.016 (ref 1.005–1.030)
Squamous Epithelial / LPF: NONE SEEN (ref 0–5)
pH: 5 (ref 5.0–8.0)

## 2019-04-07 LAB — BLOOD GAS, VENOUS
Acid-base deficit: 0.4 mmol/L (ref 0.0–2.0)
Bicarbonate: 25.9 mmol/L (ref 20.0–28.0)
O2 Saturation: 57 %
Patient temperature: 37
pCO2, Ven: 48 mmHg (ref 44.0–60.0)
pH, Ven: 7.34 (ref 7.250–7.430)
pO2, Ven: 32 mmHg (ref 32.0–45.0)

## 2019-04-07 LAB — CBC WITH DIFFERENTIAL/PLATELET
Abs Immature Granulocytes: 0.03 10*3/uL (ref 0.00–0.07)
Basophils Absolute: 0.1 10*3/uL (ref 0.0–0.1)
Basophils Relative: 1 %
Eosinophils Absolute: 0 10*3/uL (ref 0.0–0.5)
Eosinophils Relative: 0 %
HCT: 42.8 % (ref 39.0–52.0)
Hemoglobin: 14.5 g/dL (ref 13.0–17.0)
Immature Granulocytes: 0 %
Lymphocytes Relative: 10 %
Lymphs Abs: 1.3 10*3/uL (ref 0.7–4.0)
MCH: 29.2 pg (ref 26.0–34.0)
MCHC: 33.9 g/dL (ref 30.0–36.0)
MCV: 86.3 fL (ref 80.0–100.0)
Monocytes Absolute: 0.8 10*3/uL (ref 0.1–1.0)
Monocytes Relative: 7 %
Neutro Abs: 10.6 10*3/uL — ABNORMAL HIGH (ref 1.7–7.7)
Neutrophils Relative %: 82 %
Platelets: 375 10*3/uL (ref 150–400)
RBC: 4.96 MIL/uL (ref 4.22–5.81)
RDW: 12.7 % (ref 11.5–15.5)
WBC: 12.9 10*3/uL — ABNORMAL HIGH (ref 4.0–10.5)
nRBC: 0 % (ref 0.0–0.2)

## 2019-04-07 LAB — SARS CORONAVIRUS 2 BY RT PCR (HOSPITAL ORDER, PERFORMED IN ~~LOC~~ HOSPITAL LAB): SARS Coronavirus 2: NEGATIVE

## 2019-04-07 LAB — PROTIME-INR
INR: 1.1 (ref 0.8–1.2)
Prothrombin Time: 13.7 seconds (ref 11.4–15.2)

## 2019-04-07 LAB — TROPONIN I (HIGH SENSITIVITY)
Troponin I (High Sensitivity): 15 ng/L (ref <18)
Troponin I (High Sensitivity): 17 ng/L (ref <18)

## 2019-04-07 LAB — LIPASE, BLOOD: Lipase: 40 U/L (ref 11–51)

## 2019-04-07 LAB — BRAIN NATRIURETIC PEPTIDE: B Natriuretic Peptide: 56 pg/mL (ref 0.0–100.0)

## 2019-04-07 MED ORDER — ONDANSETRON HCL 4 MG/2ML IJ SOLN
4.0000 mg | Freq: Once | INTRAMUSCULAR | Status: AC
  Administered 2019-04-07: 20:00:00 4 mg via INTRAVENOUS
  Filled 2019-04-07: qty 2

## 2019-04-07 MED ORDER — IOHEXOL 300 MG/ML  SOLN
100.0000 mL | Freq: Once | INTRAMUSCULAR | Status: AC | PRN
  Administered 2019-04-07: 22:00:00 100 mL via INTRAVENOUS

## 2019-04-07 MED ORDER — IOHEXOL 240 MG/ML SOLN
50.0000 mL | Freq: Once | INTRAMUSCULAR | Status: AC | PRN
  Administered 2019-04-07: 19:00:00 50 mL via ORAL

## 2019-04-07 MED ORDER — SODIUM CHLORIDE 0.9 % IV BOLUS
1000.0000 mL | Freq: Once | INTRAVENOUS | Status: AC
  Administered 2019-04-07: 1000 mL via INTRAVENOUS

## 2019-04-07 NOTE — ED Triage Notes (Signed)
To ED ED reporting chest pains and SOB that have been intermittent for the past 8 days. Most recent episode started 2 hours ago. Pt originally pacing around treatment room and then insisting he needed to lay flat on the bed with legs elevated in the air.   Pt has 2 ostomies noted. Ostomy on pts right lower abd has an ostomy bag but is secured using cloth and torn clothing. Ostomy on pts left upper abd is uncovered upon arrival. Ostomy is bright red and does not show signs of infection but is swollen around the site. PT reports slight amount of abd pain as well as chest pain.

## 2019-04-07 NOTE — ED Notes (Signed)
Pt sitting in bed watching TV, NAD noted, call light in reach

## 2019-04-07 NOTE — ED Provider Notes (Addendum)
[facility] Emergency Department Provider Note  ____________________________________________   First MD Initiated Contact with Patient 04/07/19 1751     (approximate)  I have reviewed the triage vital signs and the nursing notes.  History  Chief Complaint Chest Pain and Shortness of Breath    HPI Alexander Carpenter is a 53 y.o. 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  Patient reports ongoing episodes of epigastric, lower chest pain for the last 8 days.  Sometimes associated with shortness of breath as well.  Today's episode started approximately 2 hours ago.  Associated with nausea and vomiting.  Caveat: History obtained with the assistance of an interpreter.  Patient is an extremely poor historian.         Past Medical Hx Past Medical History:  Diagnosis Date  . Gastritis     Problem List Patient Active Problem List   Diagnosis Date Noted  . Colostomy status ([place])   . Preop examination   . Diverticulosis of large intestine without diverticulitis   . Colonic edema   . Diverticulitis of sigmoid colon 07/07/2018  . Large bowel obstruction ([place]) 07/07/2018  . Cecum perforation 07/07/2018  . Pneumoperitoneum     Past Surgical Hx Past Surgical History:  Procedure Laterality Date  . COLON SURGERY    . COLONOSCOPY WITH PROPOFOL N/A 03/01/2019   Procedure: COLONOSCOPY WITH PROPOFOL;  Surgeon: Virgel Manifold, MD;  Location: [place];  Service: Endoscopy;  Laterality: N/A;  colon scope out of rectum @ 0919  and back in stoma @ 0921  . COLOSTOMY    . LAPAROTOMY N/A 06/24/2018   Procedure: EXPLORATORY LAPAROTOMY;  Surgeon: Olean Ree, MD;  Location: [facility];  Service: General;  Laterality: N/A;    Medications Prior to Admission medications   Medication Sig Start Date End  Date Taking? Authorizing Provider  bisacodyl (DULCOLAX) 5 MG EC tablet Take all 4 tablets at 8 am the morning prior to your surgery. Patient not taking: Reported on 03/03/2019 02/11/19   Olean Ree, MD  erythromycin base (E-MYCIN) 500 MG tablet Take 2 tablets at 8am, 2 tablets at 2pm, and 2 tablets at 8pm the day prior to surgery. Patient not taking: Reported on 03/03/2019 02/11/19   Olean Ree, MD  ferrous sulfate 325 (65 FE) MG tablet Take 325 mg by mouth daily with breakfast.    [provider]  neomycin (MYCIFRADIN) 500 MG tablet Take 2 tablet at 8am, take 2 tablets at 2pm, and take 2 tablets at 8pm the day prior to your surgery Patient not taking: Reported on 03/03/2019 02/11/19   Olean Ree, MD  polyethylene glycol powder (GLYCOLAX/MIRALAX) 17 GM/SCOOP powder Mix whole container with 64 ounces of Gatorade, NO RED Patient not taking: Reported on 03/03/2019 02/11/19   Olean Ree, MD    Allergies Patient has no known allergies.  Family Hx History reviewed. No pertinent family history.  Social Hx Social History   Tobacco Use  . Smoking status: Never Smoker  . Smokeless tobacco: Never Used  Substance Use Topics  . Alcohol use: Yes    Alcohol/week: 3.0 standard drinks    Types: 3 Cans of beer per week  . Drug use: Never     Review of Systems  Constitutional: Negative for fever. Negative for chills. Eyes: Negative for visual changes. ENT: Negative for sore throat. Cardiovascular: + for chest pain. Respiratory: +  for shortness of breath. Gastrointestinal: + for abdominal pain, nausea, vomiting. Genitourinary: Negative for dysuria. Musculoskeletal: Negative for leg swelling. Skin: Negative for rash. Neurological: Negative for for headaches.   Physical Exam  Vital Signs: ED Triage Vitals  Enc Vitals Group     BP 04/07/19 1748 (!) 141/89     Pulse Rate 04/07/19 1745 (!) 127     Resp 04/07/19 1745 15     Temp 04/07/19 1748 98.7 F (37.1 C)     Temp  Source 04/07/19 1748 Oral     SpO2 04/07/19 1745 99 %     Weight 04/07/19 1751 152 lb (68.9 kg)     Height --      Head Circumference --      Peak Flow --      Pain Score 04/07/19 1751 8     Pain Loc --      Pain Edu? --      Excl. in GC? --     Constitutional: Alert and oriented.  Eyes: Conjunctivae clear. Sclera anicteric. Head: Normocephalic. Atraumatic. Nose: No congestion. No rhinorrhea. Mouth/Throat: Mucous membranes are moist.  Neck: No stridor.   Cardiovascular: Tachycardic, regular rhythm. No murmurs. Extremities well perfused. Respiratory: Normal respiratory effort.  Lungs CTAB. Gastrointestinal: Soft, mild upper abdominal tenderness.  End ileostomy in right lower quadrant draining  brown/green stool.  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 Musculoskeletal: No lower extremity edema. Neurologic:  Normal speech and language. No gross focal neurologic deficits are appreciated.  Skin: Skin is warm, dry and intact. No rash noted. Psychiatric: Anxious.   EKG  Personally reviewed.   Rate: 126 Rhythm: sinus Axis: normal Intervals: WNL No acute ischemic changes No STEMI    Radiology  XR chest: IMPRESSION: Bibasilar atelectasis.  CT A/P: pending   Procedures  Procedure(s) performed (including critical care):  Procedures   Initial Impression / Assessment and Plan / ED Course  53 y.o. 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 Presents for upper abdominal, chest discomfort. Nausea, vomiting, SOB.  Ddx: ACS, pancreatitis, intra-abdominal complication 2/2 surgical history, biliary colic, viral process, pulmonary infection  Plan: labs, fluids, imaging (XR and CT A/P), reassess  EKG reveals tachycardia, no acute ischemic changes. HS troponin < 18. HR improved  with fluids, symptom control.  Bicarb low at 16, gap of 19. Less likely to be DKA given only mildly elevated glucose to 200, but added on VBG, urinalysis. Also consider bicarb losses from ileostomy.  Other labs notable for AKI, receiving fluids. Increased bilirubin and LFTs, but normal lipase. Awaiting CT A/P.   Final Clinical Impression(s) / ED Diagnosis  Final diagnoses:  Chest pain in adult  Epigastric abdominal pain  Nausea and vomiting in adult  AKI (acute kidney injury) (HCC)       Note:  This document was prepared using Dragon voice recognition software and may include unintentional dictation errors.     Miguel AschoffMonks, Janney Priego L., MD 04/07/19 2135

## 2019-04-08 ENCOUNTER — Other Ambulatory Visit: Payer: Self-pay

## 2019-04-08 LAB — TROPONIN I (HIGH SENSITIVITY): Troponin I (High Sensitivity): 15 ng/L (ref <18)

## 2019-04-08 NOTE — ED Notes (Signed)
Blood sent to lab for second troponin. Pt sleeping quietly. Pt will not be admitted per surgeon.

## 2019-04-08 NOTE — ED Notes (Signed)
Pt colostomy bag is open and stool has leaked on to the bed. Pt also has shoestrings tied to his colostomy and bag and clothes are dirty, torn and covered in feces. Pt cleaned and new gown placed. Pt has redness down into his groin from the leakage. Barrier cream applied. New colostomy and backing placed. Abd pad placed over left side stoma with paper tape. Pt covered with warm blanket. Spoke with pt via interpreter that he will be admitted and has a bowel obstruction and that they may require an NG tube. Pt declined and said that he has no pain at this time.

## 2019-04-08 NOTE — Consult Note (Signed)
SURGICAL CONSULTATION NOTE   HISTORY OF PRESENT ILLNESS (HPI):  53 y.o. male presented to [facility] ED for evaluation of chest pain, palpitations, upper extremity numbness and abdominal pain since yesterday afternoon. Patient reports he has been feeling this chest pain and palpitations numerous times in the last few month.  This makes him feel very uncomfortable and he cannot stay steady.  He also reports that when he feels like that something he needs to lay down and put his feet up.  He reports that he has had some nausea and vomiting with this episode of chest pain and palpitations.  There is no specific pain site.  There is no pain radiation.  There is no alleviating or rating factor.  Patient reported that he has not eaten today but he drank 2 beers.  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  He has been work-up for ileostomy takedown.  In the meantime he was found to have uncontrolled diabetes and the surgery for ileostomy takedown has been put on hold.  Patient has been seen for optimization of his diabetes and was started on new diabetes medication recently.  He was also started on cholesterol medications.  He cannot recall the new medications.  ED evaluation shows elevated white blood cell count, elevated creatinine, elevated bilirubin, elevated AST ALT and acidosis.  CT scan of the abdomen and pelvis shows focal dilation of the distal ileum due to edematous ileostomy.  Proximal small bowel is nondilated.  I personally evaluated the images.  Significant amount of contrast in the stomach.  Surgery is consulted by Dr. Manson Passey in this context for evaluation and management of bowel obstruction.  PAST MEDICAL HISTORY (PMH):  Past Medical History:  Diagnosis Date  . Gastritis      PAST SURGICAL HISTORY (PSH):  Past Surgical History:  Procedure Laterality Date  . COLON SURGERY    . COLONOSCOPY WITH PROPOFOL N/A 03/01/2019   Procedure: COLONOSCOPY WITH  PROPOFOL;  Surgeon: Pasty Spillers, MD;  Location: [facility];  Service: Endoscopy;  Laterality: N/A;  colon scope out of rectum @ 0919  and back in stoma @ 0921  . COLOSTOMY    . LAPAROTOMY N/A 06/24/2018   Procedure: EXPLORATORY LAPAROTOMY;  Surgeon: Henrene Dodge, MD;  Location: [facility];  Service: General;  Laterality: N/A;     MEDICATIONS:  Prior to Admission medications   Medication Sig Start Date End Date Taking? Authorizing Provider  bisacodyl (DULCOLAX) 5 MG EC tablet Take all 4 tablets at 8 am the morning prior to your surgery. Patient not taking: Reported on 03/03/2019 02/11/19   Henrene Dodge, MD  erythromycin base (E-MYCIN) 500 MG tablet Take 2 tablets at 8am, 2 tablets at 2pm, and 2 tablets at 8pm the day prior to surgery. Patient not taking: Reported on 03/03/2019 02/11/19   Henrene Dodge, MD  ferrous sulfate 325 (65 FE) MG tablet Take 325 mg by mouth daily with breakfast.    [provider]  neomycin (MYCIFRADIN) 500 MG tablet Take 2 tablet at 8am, take 2 tablets at 2pm, and take 2 tablets at 8pm the day prior to your surgery Patient not taking: Reported on 03/03/2019 02/11/19   Henrene Dodge, MD  polyethylene glycol powder (GLYCOLAX/MIRALAX) 17 GM/SCOOP powder Mix whole container with 64 ounces of Gatorade, NO RED Patient not taking: Reported on 03/03/2019 02/11/19   Henrene Dodge, MD     ALLERGIES:  No Known Allergies   SOCIAL HISTORY:  Social History   Socioeconomic History  . Marital status: Single    Spouse name: Not on file  . Number of children: Not on file  . Years of education: Not on file  . Highest education level: Not on file  Occupational History  . Not on file  Social Needs  . Financial resource strain: Not on file  . Food insecurity    Worry: Not on file    Inability: Not on file  . Transportation needs    Medical: Not on file    Non-medical: Not on file  Tobacco Use  . Smoking status: Never Smoker  . Smokeless tobacco: Never  Used  Substance and Sexual Activity  . Alcohol use: Yes    Alcohol/week: 3.0 standard drinks    Types: 3 Cans of beer per week  . Drug use: Never  . Sexual activity: Yes  Lifestyle  . Physical activity    Days per week: Not on file    Minutes per session: Not on file  . Stress: Not on file  Relationships  . Social Herbalist on phone: Not on file    Gets together: Not on file    Attends religious service: Not on file    Active member of club or organization: Not on file    Attends meetings of clubs or organizations: Not on file    Relationship status: Not on file  . Intimate partner violence    Fear of current or ex partner: Not on file    Emotionally abused: Not on file    Physically abused: Not on file    Forced sexual activity: Not on file  Other Topics Concern  . Not on file  Social History Narrative  . Not on file     FAMILY HISTORY:  History reviewed. No pertinent family history.   REVIEW OF SYSTEMS:  Constitutional: denies weight loss, fever, chills, or sweats.  Positive for fatigue Eyes: denies any other vision changes, history of eye injury  ENT: denies sore throat, hearing problems  Respiratory: Positive shortness of breath, negative wheezing  Cardiovascular: Positive chest pain, palpitations  Gastrointestinal: Positive abdominal pain, positive nausea and vomiting  Genitourinary: denies burning with urination or urinary frequency Musculoskeletal: denies any other joint pains or cramps  Skin: denies any other rashes or skin discolorations  Neurological: denies any other headache, positive dizziness, weakness  Psychiatric: denies any other depression, anxiety   All other review of systems were negative   VITAL SIGNS:  Temp:  [98.7 F (37.1 C)] 98.7 F (37.1 C) (09/03 1748) Pulse Rate:  [78-127] 78 (09/04 0030) Resp:  [15-31] 15 (09/03 1945) BP: (104-148)/(71-111) 116/86 (09/04 0030) SpO2:  [92 %-100 %] 100 % (09/04 0030) Weight:  [68.9 kg]  68.9 kg (09/03 1751)       Weight: 68.9 kg     INTAKE/OUTPUT:  This shift: No intake/output data recorded.  Last 2 shifts: @IOLAST2SHIFTS @   PHYSICAL EXAM:  Constitutional:  -- Normal body habitus  -- Awake, alert, and oriented x3  Eyes:  -- Pupils equally round and reactive to light  -- No scleral icterus  Ear, nose, and throat:  -- No jugular venous distension  Pulmonary:  -- No crackles  -- Equal breath sounds bilaterally -- Breathing non-labored at rest Cardiovascular:  -- S1, S2 present  -- No pericardial rubs Gastrointestinal:  -- Abdomen soft, nontender, non-distended, no guarding or rebound tenderness --Right lower quadrant and ileostomy with moderate to severe  edema.  The utilization of the ileostomy is patent.  There is significant amount of stool in the bag.  Patient was passing gas and stool during my evaluation. Musculoskeletal and Integumentary:  -- Wounds or skin discoloration: None appreciated -- Extremities: B/L UE and LE FROM, hands and feet warm, no edema  Neurologic:  -- Motor function: intact and symmetric -- Sensation: intact and symmetric   Labs:  CBC Latest Ref Rng & Units 04/07/2019 03/07/2019 06/29/2018  WBC 4.0 - 10.5 K/uL 12.9(H) 5.4 7.1  Hemoglobin 13.0 - 17.0 g/dL 16.114.5 09.614.3 12.5(L)  Hematocrit 39.0 - 52.0 % 42.8 43.9 38.7(L)  Platelets 150 - 400 K/uL 375 277 339   CMP Latest Ref Rng & Units 04/07/2019 03/07/2019 06/26/2018  Glucose 70 - 99 mg/dL 045(W222(H) 098(J366(H) 191(Y181(H)  BUN 6 - 20 mg/dL 78(G21(H) 8 95(A25(H)  Creatinine 0.61 - 1.24 mg/dL 2.13(Y1.44(H) 8.650.91 7.841.06  Sodium 135 - 145 mmol/L 135 138 139  Potassium 3.5 - 5.1 mmol/L 4.0 4.1 4.0  Chloride 98 - 111 mmol/L 100 104 105  CO2 22 - 32 mmol/L 16(L) 23 28  Calcium 8.9 - 10.3 mg/dL 9.7 6.9(G8.5(L) 2.9(B8.0(L)  Total Protein 6.5 - 8.1 g/dL 8.3(H) - -  Total Bilirubin 0.3 - 1.2 mg/dL 2.8(U1.6(H) - -  Alkaline Phos 38 - 126 U/L 87 - -  AST 15 - 41 U/L 55(H) - -  ALT 0 - 44 U/L 108(H) - -   Imaging studies:  EXAM: CT ABDOMEN  AND PELVIS WITH CONTRAST  TECHNIQUE: Multidetector CT imaging of the abdomen and pelvis was performed using the standard protocol following bolus administration of intravenous contrast.  CONTRAST:  100mL OMNIPAQUE IOHEXOL 300 MG/ML  SOLN  COMPARISON:  CT 06/24/2018  FINDINGS: Lower chest: Lung bases demonstrate no acute consolidation or effusion. The heart size is normal.  Hepatobiliary: No focal liver abnormality is seen. No gallstones, gallbladder wall thickening, or biliary dilatation.  Pancreas: Unremarkable. No pancreatic ductal dilatation or surrounding inflammatory changes.  Spleen: Normal in size without focal abnormality.  Adrenals/Urinary Tract: Adrenal glands are normal. Kidneys show no hydronephrosis. Prominent ureters bilaterally but no ureteral stone. Distended urinary bladder.  Stomach/Bowel: The stomach is moderately enlarged. Interval partial colectomy changes. The right colon has been resected. Blind ending proximal transverse colon stump ends in a left abdominal colostomy. Large fat containing parastomal hernia. Resection of previously noted sigmoid colon mass and stricture. Blind ending rectosigmoid stump is unremarkable.  Right lower quadrant ileostomy. There is edema and thickening at the right lower quadrant stoma. The small bowel upstream from the ostomy is abnormally dilated, measuring up to 3.8 cm and has a fecalized pattern, consistent with obstruction. Remainder of the small bowel is nonenlarged.  Vascular/Lymphatic: Nonaneurysmal aorta. No significantly enlarged lymph nodes.  Reproductive: Prostate is unremarkable.  Other: Negative for free air or free fluid. Small fat in the umbilical region  Musculoskeletal: Degenerative changes of the spine. Trace retrolisthesis L3 on L4. No acute or suspicious osseous abnormality  IMPRESSION: 1. Interval postsurgical changes of the colon with resection of the right colon and the  previously noted sigmoid colon mass and stricture. Creation of right lower quadrant ileostomy, left abdominal colostomy, and Hartmann pouch. Abnormal thickening and edema at the orifice of the right lower quadrant ostomy with dilatation and fecalization of the small bowel upstream to the ostomy, consistent with obstruction, either from stricture at the ostomy or edema from inflammation. 2. 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  Electronically Signed   By: Jasmine PangKim  Fujinaga M.D.   On: 04/07/2019 22:36  Assessment/Plan:  53 y.o. male with multiple complaints of chest pain, palpitations, abdominal pain, upper extremity numbness.  Difficult to correlate all symptoms with patient history.  Surgery was consulted due to CT scan report documenting small bowel obstruction.  I personally evaluated the images and correlated clinically.  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  There is no small bowel obstruction and the ileostomy is patent.  Patient is passing stool.  This distal small bowel dilation with fecalization does not explain patient's chest pain, palpitations, increase his creatinine, increased bilirubin, increased AST/ALT.I considered that there is a combination of possible excessive alcohol intake, new diabetes and cholesterol medications and patient's poor personal care.  Patient arrived to the ED with heart rate over 120 and with hydration now is in the 70s.  Patient diabetes continue to be uncontrolled today with glucose over 200 and ketones in the urine.  Patient may have a component of dehydration since she told me that he has not eaten anything in the last 24 hours other than the 2 beers that he drank.  From surgery standpoint, I do not consider that he has a small bowel obstruction and patient can have a trial of p.o. intake.  If he is medically stable and increased creatinine, AST/ALT,  bilirubin and uncontrolled diabetes are not an indication for admission, patient can be discharged from surgical standpoint.  At this moment there is no need of nasogastric tube or any surgical intervention.  Gae GallopEdgardo Cintrn-Daz, MD

## 2019-04-08 NOTE — ED Notes (Signed)
Pt left facility without d/c instructions and with IV in arm. Charge RN called pt who came back to have IV removed and collect d/c papers.

## 2019-04-12 ENCOUNTER — Telehealth: Payer: Self-pay

## 2019-04-12 NOTE — Telephone Encounter (Signed)
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 He should have another colonoscopy 6-12 months after his reversal surgery. Please set recall

## 2019-04-12 NOTE — Telephone Encounter (Signed)
Pt notified of results by [employer] interpretors [phone]).  Will send recall for 6-12 months to remind pt of repeat colonoscopy after his reversal surgery.

## 2019-04-13 ENCOUNTER — Encounter: Payer: Self-pay | Admitting: *Deleted

## 2019-04-13 NOTE — Progress Notes (Addendum)
Patient was seen in the ED on 04-07-19 and 04-08-19 due to chest pain and SOB.  Per Angie B's request, I did contact [facility] today to see when patient is scheduled for a follow up appointment.   Per Verdis Frederickson at [facility], patient is to follow up with their office on 04-26-19 with Hendricks Milo, NP. (Patient was last seen on 03-29-19 with their office.) *Medical clearance due to elevated glucose will be determined at this visit. Medical clearance request form was previously faxed to their office and given to [place].   I did call patient with the help of [id number] Thedore Mins ID# [id number] but there was no answer. Interpreter did leave a message for the patient to call the office. He does need to be scheduled for a follow up appointment with Dr. Hampton Abbot after appointment with primary care physician per Angie.   Note routed to Angie B.

## 2019-10-14 ENCOUNTER — Telehealth: Payer: Self-pay | Admitting: Gastroenterology

## 2019-10-14 NOTE — Telephone Encounter (Signed)
Patient called & needs to re-schedule a colonoscopy from 2020. He is not having the surgery with Dr Everlene Farrier per patient.

## 2019-10-18 NOTE — Telephone Encounter (Signed)
Returned patients call regarding scheduling colonoscopy.  I advised per Dr. Maximino Greenland that he does not need the colonoscopy until after his surgery.  He states that his procedure with Dr. Everlene Farrier was canceled due to high glucose, and high cholesterol.  Shawna Orleans has scheduled patient a virtual visit with Dr. Maximino Greenland.  Thanks,  [place], [place]

## 2019-10-19 ENCOUNTER — Encounter: Payer: Self-pay | Admitting: Gastroenterology

## 2019-10-20 ENCOUNTER — Telehealth: Payer: Self-pay | Admitting: Emergency Medicine

## 2019-10-20 ENCOUNTER — Ambulatory Visit (INDEPENDENT_AMBULATORY_CARE_PROVIDER_SITE_OTHER): Payer: Self-pay | Admitting: Gastroenterology

## 2019-10-20 ENCOUNTER — Encounter: Payer: Self-pay | Admitting: Gastroenterology

## 2019-10-20 DIAGNOSIS — Z8719 Personal history of other diseases of the digestive system: Secondary | ICD-10-CM

## 2019-10-20 NOTE — Telephone Encounter (Signed)
Called pt and made f/u visit with Dr Aleen Campi on 10/17/19 at 945am.  Per pt he was last seen at the [facility] back in 04/2019 and was given glucometer to check his sugar and prescribed Januvia 50mg . Pt states he recently called about 2 months ago to the clinic due to running out of medication. He states they refilled medication again but was not seen. Pt has been checking sugar 2-3 times a week. Last time he checked sugar was Monday 3/15 and was in the 150s per pt.   Lab records obtained from [place] clinic.

## 2019-10-20 NOTE — Progress Notes (Signed)
Alexander Carpenter [address]  [address]  [place], [place] [address]  Main: [phone]  Fax: [phone]   Gastroenterology Consultation  Referring Provider:     [facility], [facility] Primary Care Physician:  Inc, [facility] Reason for Consultation:    History of diverticulitis        HPI:   Virtual Visit via Telephone Note  I connected with patient on 10/20/19 at 11:00 AM EDT by telephone and verified that I am speaking with the correct person using two identifiers.   I discussed the limitations, risks, security and privacy concerns of performing an evaluation and management service by telephone and the availability of in person appointments. I also discussed with the patient that there may be a patient responsible charge related to this service. The patient expressed understanding and agreed to proceed.  Location of the patient: Home Location of provider: Home Participating persons: Patient and provider only. Spanish Interpreter ID [phone]   History of Present Illness: Chief Complaint  Patient presents with  . Colon Cancer Screening    Patient states he needs a colonoscopy but his procedure with Dr. Everlene Farrier was cancel due to high cholesterol and high glcouse.      Alexander Carpenter is a 54 y.o. y/o male referred for consultation & management  by Dr. Avnet, [employer]  Patient evaluated with Spanish interpreter.  History of diverticulitis with colon perforation requiring emergency surgery in November 2019.  And previously seen for preoperative colonoscopy prior to ileostomy takedown.  Underwent colonoscopy which showed patchy area of mild decreased vascular pattern in the Hartman's pouch.  Patent end colostomy with healthy-appearing mucosa.  Mucus balls in the colon.  Few small mouth diverticuli.  Recommendation was to repeat colonoscopy 6 months post surgery for screening.  The patient denies abdominal or flank pain, anorexia, nausea  or vomiting, dysphagia, change in bowel habits or black or bloody stools or weight loss.  Patient has not undergone ileostomy takedown yet due to his elevated blood sugars previously.  Patient does not have follow-up scheduled with Dr. Aleen Campi, his surgeon.  Past Medical History:  Diagnosis Date  . Gastritis     Past Surgical History:  Procedure Laterality Date  . COLON SURGERY    . COLONOSCOPY WITH PROPOFOL N/A 03/01/2019   Procedure: COLONOSCOPY WITH PROPOFOL;  Surgeon: Pasty Spillers, MD;  Location: [facility];  Service: Endoscopy;  Laterality: N/A;  colon scope out of rectum @ 0919  and back in stoma @ 0921  . COLOSTOMY    . LAPAROTOMY N/A 06/24/2018   Procedure: EXPLORATORY LAPAROTOMY;  Surgeon: Henrene Dodge, MD;  Location: [facility];  Service: General;  Laterality: N/A;    Prior to Admission medications   Medication Sig Start Date End Date Taking? Authorizing Provider  ferrous sulfate 325 (65 FE) MG tablet Take 325 mg by mouth daily with breakfast.   Yes [provider]  sitaGLIPtin (JANUVIA) 50 MG tablet Take 50 mg by mouth daily.   Yes [provider]    History reviewed. No pertinent family history.   Social History   Tobacco Use  . Smoking status: Never Smoker  . Smokeless tobacco: Never Used  Substance Use Topics  . Alcohol use: Yes    Alcohol/week: 3.0 standard drinks    Types: 3 Cans of beer per week  . Drug use: Never    Allergies as of 10/20/2019  . (No Known Allergies)    Review of Systems:  All systems reviewed and negative except where noted in HPI.   Observations/Objective:  Labs: CBC    Component Value Date/Time   WBC 12.9 (H) 04/07/2019 1754   RBC 4.96 04/07/2019 1754   HGB 14.5 04/07/2019 1754   HGB 12.4 (L) 04/16/2013 1639   HCT 42.8 04/07/2019 1754   HCT 37.9 (L) 04/16/2013 1639   PLT 375 04/07/2019 1754   PLT 267 04/16/2013 1639   MCV 86.3 04/07/2019 1754   MCV 81 04/16/2013 1639   MCH 29.2  04/07/2019 1754   MCHC 33.9 04/07/2019 1754   RDW 12.7 04/07/2019 1754   RDW 16.6 (H) 04/16/2013 1639   LYMPHSABS 1.3 04/07/2019 1754   MONOABS 0.8 04/07/2019 1754   EOSABS 0.0 04/07/2019 1754   BASOSABS 0.1 04/07/2019 1754   CMP     Component Value Date/Time   NA 135 04/07/2019 1754   NA 140 04/16/2013 1639   K 4.0 04/07/2019 1754   K 3.5 04/16/2013 1639   CL 100 04/07/2019 1754   CL 109 (H) 04/16/2013 1639   CO2 16 (L) 04/07/2019 1754   CO2 23 04/16/2013 1639   GLUCOSE 222 (H) 04/07/2019 1754   GLUCOSE 137 (H) 04/16/2013 1639   BUN 21 (H) 04/07/2019 1754   BUN 15 04/16/2013 1639   CREATININE 1.44 (H) 04/07/2019 1754   CREATININE 0.90 04/16/2013 1639   CALCIUM 9.7 04/07/2019 1754   CALCIUM 8.2 (L) 04/16/2013 1639   PROT 8.3 (H) 04/07/2019 1754   PROT 7.4 04/16/2013 1639   ALBUMIN 4.8 04/07/2019 1754   ALBUMIN 3.7 04/16/2013 1639   AST 55 (H) 04/07/2019 1754   AST 27 04/16/2013 1639   ALT 108 (H) 04/07/2019 1754   ALT 29 04/16/2013 1639   ALKPHOS 87 04/07/2019 1754   ALKPHOS 78 04/16/2013 1639   BILITOT 1.6 (H) 04/07/2019 1754   BILITOT 0.2 04/16/2013 1639   GFRNONAA 55 (L) 04/07/2019 1754   GFRNONAA >60 04/16/2013 1639   GFRAA >60 04/07/2019 1754   GFRAA >60 04/16/2013 1639    Imaging Studies: No results found.  Assessment and Plan:   Navjot Loera is a 54 y.o. y/o male has been referred for history of diverticulitis  Assessment and Plan: Patient is not having any new symptoms We discussed that he does not need a repeat colonoscopy at this time but should have 1 about 3 to 6 months after his ileostomy takedown.  We have asked him to follow-up with Dr. Hampton Abbot to reschedule his surgery.  I also reached out to Dr. Hampton Abbot via epic and he states he will have his clinic call him to schedule an appointment.  Our staff reached out to his clinic as well and they will call him to schedule his appointment.  Follow-up with [place] in 6 months and if he has  undergone ileostomy takedown by then, scheduled for screening colonoscopy  Follow Up Instructions:   I discussed the assessment and treatment plan with the patient. The patient was provided an opportunity to ask questions and all were answered. The patient agreed with the plan and demonstrated an understanding of the instructions.   The patient was advised to call back or seek an in-person evaluation if the symptoms worsen or if the condition fails to improve as anticipated.  I provided 12 minutes of non-face-to-face time during this encounter.   Virgel Manifold, MD  Speech recognition software was used to dictate the above note.

## 2019-10-21 ENCOUNTER — Telehealth: Payer: Self-pay | Admitting: Emergency Medicine

## 2019-10-21 NOTE — Telephone Encounter (Signed)
Per Dr Aleen Campi he wants pt to see PCP for medical clearance prior to appointment on 11/02/19.   Spoke to [place] at the [place] clinic and she made pt an appt with Shane Crutch on Monday 10/24/19 at 9:20am and to get Lab work done as well.   Pt made aware of this appt and voiced understanding.

## 2019-10-21 NOTE — Telephone Encounter (Signed)
Medical Clearance form faxed to Shane Crutch at the [place] clinic [phone]) at this time.

## 2019-10-25 ENCOUNTER — Telehealth: Payer: Self-pay | Admitting: Emergency Medicine

## 2019-10-25 NOTE — Telephone Encounter (Signed)
Medical Clearance received from [facility]. She is recommending further workup prior to surgery. Abnormal EKG, being referred to Cardiologist.   Spoke to [place] at [facility]. States they sent referral to [facility] in [place] this morning 10/25/19. No appts set up yet. Will follow up.

## 2019-10-30 ENCOUNTER — Ambulatory Visit: Payer: Self-pay | Attending: Internal Medicine

## 2019-10-30 DIAGNOSIS — Z23 Encounter for immunization: Secondary | ICD-10-CM

## 2019-10-30 NOTE — Progress Notes (Signed)
   Covid-19 Vaccination Clinic  Name:  Alexander Carpenter    MRN: [id number] DOB: [birth year]  10/30/2019  [name] was observed post Covid-19 immunization for 30 minutes based on pre-vaccination screening without incident. He was provided with Vaccine Information Sheet and instruction to access the V-Safe system.   Mr. Soy was instructed to call 911 with any severe reactions post vaccine: Marland Kitchen Difficulty breathing  . Swelling of face and throat  . A fast heartbeat  . A bad rash all over body  . Dizziness and weakness   Immunizations Administered    Name Date Dose VIS Date Route   Pfizer COVID-19 Vaccine 10/30/2019 12:07 PM 0.3 mL 07/15/2019 Intramuscular   Manufacturer: [employer], [employer]   Lot: [id number]   NDC: [address]-5

## 2019-11-02 ENCOUNTER — Ambulatory Visit: Payer: Self-pay | Admitting: Surgery

## 2019-11-20 ENCOUNTER — Ambulatory Visit: Payer: Self-pay | Attending: Internal Medicine

## 2019-11-20 DIAGNOSIS — Z23 Encounter for immunization: Secondary | ICD-10-CM

## 2019-11-20 NOTE — Progress Notes (Signed)
   Covid-19 Vaccination Clinic  Name:  Noboru Bidinger    MRN: [id number] DOB: Mar 06, 1966  11/20/2019  Mr. Gust was observed post Covid-19 immunization for 15 minutes without incident. He was provided with Vaccine Information Sheet and instruction to access the V-Safe system.   Mr. Trebilcock was instructed to call 911 with any severe reactions post vaccine: Marland Kitchen Difficulty breathing  . Swelling of face and throat  . A fast heartbeat  . A bad rash all over body  . Dizziness and weakness   Immunizations Administered    Name Date Dose VIS Date Route   Pfizer COVID-19 Vaccine 11/20/2019 11:39 AM 0.3 mL 07/15/2019 Intramuscular   Manufacturer: [employer], [employer]   Lot: [id number]   NDC: [address]-6

## 2019-11-23 ENCOUNTER — Encounter: Payer: Self-pay | Admitting: Surgery

## 2019-11-23 ENCOUNTER — Other Ambulatory Visit: Payer: Self-pay

## 2019-11-23 ENCOUNTER — Ambulatory Visit (INDEPENDENT_AMBULATORY_CARE_PROVIDER_SITE_OTHER): Payer: Self-pay | Admitting: Surgery

## 2019-11-23 VITALS — BP 121/78 | HR 81 | Temp 95.2°F | Ht 65.0 in | Wt 160.8 lb

## 2019-11-23 DIAGNOSIS — Z933 Colostomy status: Secondary | ICD-10-CM

## 2019-11-23 DIAGNOSIS — Z932 Ileostomy status: Secondary | ICD-10-CM

## 2019-11-23 NOTE — H&P (View-Only) (Signed)
11/23/2019  History of Present Illness: Alexander Carpenter is a 54 y.o. male presenting for follow up of diverticulitis which resulted in cecal perforation, s/p exlap, sigmoidectomy and right colectomy, with creating of a functional end ileostomy and an end non-functional colostomy in 06/2018.  Due to COVID-19 pandemic, his follow up colonoscopy was not done until 02/2019, and then there were follow up issues with the patient, as well as less controlled diabetes that prevented surgery for reversal of the ostomies.  His A1C was as high as 10.5 in 03/2019.  He has worked with his glucose management and his most recent A1C in 10/2019 was 8.2, with glucose of 137 on that labwork.  He also saw cardiology for clearance due to abnormal EKG and has been cleared.  The patient reports that he's doing well.  He has lost a few pounds with this improved glucose control.  He denies any abdominal pain.  He does have issues with heartburn if he eats spicy foods, but can control with omeprazole.  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  He also is running out of ostomy appliance supplies and asked for [place] to give him some to cover him until surgery.  He has the bag but not the plate that goes to the skin.  Past Medical History: Past Medical History:  Diagnosis Date  . Diabetes (HCC)   . Diverticulitis of large intestine with perforation   . Gastritis   . Hyperlipidemia      Past Surgical History: Past Surgical History:  Procedure Laterality Date  . COLON SURGERY    . COLONOSCOPY WITH PROPOFOL N/A 03/01/2019   Procedure: COLONOSCOPY WITH PROPOFOL;  Surgeon: Pasty Spillers, MD;  Location: [facility];  Service: Endoscopy;  Laterality: N/A;  colon scope out of rectum @ 0919  and back in stoma @ 0921  . COLOSTOMY    . LAPAROTOMY N/A 06/24/2018   Procedure: EXPLORATORY LAPAROTOMY;  Surgeon: Henrene Dodge, MD;  Location: [facility];  Service: General;  Laterality: N/A;     Home Medications: Prior to Admission medications   Medication Sig Start Date End Date Taking? Authorizing Provider  atorvastatin (LIPITOR) 10 MG tablet Take by mouth.   Yes [provider]  b complex vitamins capsule Take by mouth.   Yes [provider]  omeprazole (PRILOSEC) 20 MG capsule Take by mouth. 11/18/19 11/17/20 Yes [provider]  sitaGLIPtin (JANUVIA) 50 MG tablet Take 50 mg by mouth daily.   Yes [provider]  ferrous sulfate 325 (65 FE) MG tablet Take 325 mg by mouth daily with breakfast.    [provider]    Allergies: No Known Allergies  Review of Systems: Review of Systems  Constitutional: Negative for chills and fever.  Respiratory: Negative for shortness of breath.   Cardiovascular: Negative for chest pain.  Gastrointestinal: Negative for abdominal pain, nausea and vomiting.  Genitourinary: Negative for dysuria.    Physical Exam BP 121/78   Pulse 81   Temp (!) 95.2 F (35.1 C) (Temporal)   Ht 5\' 5"  (1.651 m)   Wt 160 lb 12.8 oz (72.9 kg)   SpO2 97%   BMI 26.76 kg/m  CONSTITUTIONAL: No acute distress HEENT:  Normocephalic, atraumatic, extraocular motion intact. RESPIRATORY:  Lungs are clear, and breath sounds are equal bilaterally. Normal respiratory effort without pathologic use of accessory muscles. CARDIOVASCULAR: Heart is regular without murmurs, gallops, or rubs. GI: The abdomen is soft, non-distended, non-tender to  palpation.  The patient's midline incision is well healed, with a small umbilical hernia.  The left end colostomy (mucous) has a parastomal hernia that is reducible without tenderness.  The mucosa is pink and viable.  The end ileostomy (functional) is without hernia, with pink and viable mucosa. NEUROLOGIC:  Motor and sensation is grossly normal.  Cranial nerves are grossly intact. PSYCH:  Alert and oriented to person, place and time. Affect is normal.  Labs/Imaging: A1C 8.2, glucose  137.  Assessment and Plan: This is a 53 y.o. male with a prior history of diverticulitis of sigmoid colon with cecal perforation, s/p sigmoidectomy, right colectomy end ileostomy, and end colostomy.  --Discussed with the patient that he's been cleared for surgery by his PCP and cardiologist.  From my standpoint, the much improved A1C is reassuring and I encouraged the patient to continue such good job.   --Discussed with him the plan for OR for reversal of the end ileostomy with ileocolonic anastomosis, reversal of the end colostomy with colorectal anastomosis, and repair of parastomal hernia as well as umbilical hernia.  Discussed the possibility of a diverting loop ileostomy depending on how the anastomosis goes and the quality of the tissue.  Discussed with him the risks of bleeding, infection, injury to surrounding structures, need for further procedures, and he's willing to proceed.  Discussed hospital stay, post-op restrictions, and pain management. --Will schedule him for 4/27 with Dr. Oaks assisting.  He understands that he would need COVID testing prior to surgery, as well as the prescriptions for bowel prep, enemas, and neomycin/erythromycin.    Face-to-face time spent with the patient and care providers was 40 minutes, with more than 50% of the time spent counseling, educating, and coordinating care of the patient.     Lindel Marcell Luis Sephira Zellman, MD [facility]    

## 2019-11-23 NOTE — Progress Notes (Signed)
11/23/2019  History of Present Illness: Alexander Carpenter is a 54 y.o. male presenting for follow up of diverticulitis which resulted in cecal perforation, s/p exlap, sigmoidectomy and right colectomy, with creating of a functional end ileostomy and an end non-functional colostomy in 06/2018.  Due to COVID-19 pandemic, his follow up colonoscopy was not done until 02/2019, and then there were follow up issues with the patient, as well as less controlled diabetes that prevented surgery for reversal of the ostomies.  His A1C was as high as 10.5 in 03/2019.  He has worked with his glucose management and his most recent A1C in 10/2019 was 8.2, with glucose of 137 on that labwork.  He also saw cardiology for clearance due to abnormal EKG and has been cleared.  The patient reports that he's doing well.  He has lost a few pounds with this improved glucose control.  He denies any abdominal pain.  He does have issues with heartburn if he eats spicy foods, but can control with omeprazole.  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  He also is running out of ostomy appliance supplies and asked for [place] to give him some to cover him until surgery.  He has the bag but not the plate that goes to the skin.  Past Medical History: Past Medical History:  Diagnosis Date  . Diabetes (HCC)   . Diverticulitis of large intestine with perforation   . Gastritis   . Hyperlipidemia      Past Surgical History: Past Surgical History:  Procedure Laterality Date  . COLON SURGERY    . COLONOSCOPY WITH PROPOFOL N/A 03/01/2019   Procedure: COLONOSCOPY WITH PROPOFOL;  Surgeon: Pasty Spillers, MD;  Location: [facility];  Service: Endoscopy;  Laterality: N/A;  colon scope out of rectum @ 0919  and back in stoma @ 0921  . COLOSTOMY    . LAPAROTOMY N/A 06/24/2018   Procedure: EXPLORATORY LAPAROTOMY;  Surgeon: Henrene Dodge, MD;  Location: [facility];  Service: General;  Laterality: N/A;     Home Medications: Prior to Admission medications   Medication Sig Start Date End Date Taking? Authorizing Provider  atorvastatin (LIPITOR) 10 MG tablet Take by mouth.   Yes [provider]  b complex vitamins capsule Take by mouth.   Yes [provider]  omeprazole (PRILOSEC) 20 MG capsule Take by mouth. 11/18/19 11/17/20 Yes [provider]  sitaGLIPtin (JANUVIA) 50 MG tablet Take 50 mg by mouth daily.   Yes [provider]  ferrous sulfate 325 (65 FE) MG tablet Take 325 mg by mouth daily with breakfast.    [provider]    Allergies: No Known Allergies  Review of Systems: Review of Systems  Constitutional: Negative for chills and fever.  Respiratory: Negative for shortness of breath.   Cardiovascular: Negative for chest pain.  Gastrointestinal: Negative for abdominal pain, nausea and vomiting.  Genitourinary: Negative for dysuria.    Physical Exam BP 121/78   Pulse 81   Temp (!) 95.2 F (35.1 C) (Temporal)   Ht 5\' 5"  (1.651 m)   Wt 160 lb 12.8 oz (72.9 kg)   SpO2 97%   BMI 26.76 kg/m  CONSTITUTIONAL: No acute distress HEENT:  Normocephalic, atraumatic, extraocular motion intact. RESPIRATORY:  Lungs are clear, and breath sounds are equal bilaterally. Normal respiratory effort without pathologic use of accessory muscles. CARDIOVASCULAR: Heart is regular without murmurs, gallops, or rubs. GI: The abdomen is soft, non-distended, non-tender to  palpation.  The patient's midline incision is well healed, with a small umbilical hernia.  The left end colostomy (mucous) has a parastomal hernia that is reducible without tenderness.  The mucosa is pink and viable.  The end ileostomy (functional) is without hernia, with pink and viable mucosa. NEUROLOGIC:  Motor and sensation is grossly normal.  Cranial nerves are grossly intact. PSYCH:  Alert and oriented to person, place and time. Affect is normal.  Labs/Imaging: A1C 8.2, glucose  137.  Assessment and Plan: This is a 54 y.o. male with a prior history of diverticulitis of sigmoid colon with cecal perforation, s/p sigmoidectomy, right colectomy end ileostomy, and end colostomy.  --Discussed with the patient that he's been cleared for surgery by his PCP and cardiologist.  From my standpoint, the much improved A1C is reassuring and I encouraged the patient to continue such good job.   --Discussed with him the plan for OR for reversal of the end ileostomy with ileocolonic anastomosis, reversal of the end colostomy with colorectal anastomosis, and repair of parastomal hernia as well as umbilical hernia.  Discussed the possibility of a diverting loop ileostomy depending on how the anastomosis goes and the quality of the tissue.  Discussed with him the risks of bleeding, infection, injury to surrounding structures, need for further procedures, and he's willing to proceed.  Discussed hospital stay, post-op restrictions, and pain management. --Will schedule him for 4/27 with Dr. Genevive Bi assisting.  He understands that he would need COVID testing prior to surgery, as well as the prescriptions for bowel prep, enemas, and neomycin/erythromycin.    Face-to-face time spent with the patient and care providers was 40 minutes, with more than 50% of the time spent counseling, educating, and coordinating care of the patient.     [name], [facility]

## 2019-11-23 NOTE — Patient Instructions (Addendum)
Reversin de la ileostoma Ileostomy Reversal La reversin de la ileostoma es un procedimiento que revierte una ileostoma provisoria. El intestino delgado se desconecta de la abertura (estoma) en el abdomen. Luego se lo vuelve a conectar al resto del intestino en el interior del cuerpo. Despus de este procedimiento, ya no es necesario tener un estoma y Ardelia Mems bolsa de ileostoma, y la materia fecal puede pasar a travs de los intestinos y del recto. Informe al mdico acerca de lo siguiente:  Cualquier alergia que tenga.  Todos los [employer], incluidos vitaminas, hierbas, gotas oftlmicas, cremas y medicamentos de venta libre.  Cualquier problema previo que usted o algn miembro de su familia hayan tenido con los anestsicos.  Cualquier trastorno de la sangre que tenga.  Cirugas a las que se haya sometido.  Cualquier afeccin que tenga o haya tenido.  Si est embarazada o podra estarlo. Cules son los riesgos? En general, se trata de un procedimiento seguro. Sin embargo, pueden ocurrir complicaciones, por ejemplo:  Infeccin.  Sangrado.  Reacciones alrgicas a los medicamentos.  Daos a otras estructuras u rganos.  Estrechamiento del Sports coach donde se lo reconect.  Obstruccin intestinal (leo). Qu ocurre antes del procedimiento? Pruebas y exmenes  Le harn un examen fsico, que puede incluir un examen rectal.  Pueden hacerle estudios, por ejemplo: ? An[place] de [place]. ? Pruebas de heces. ? Radiografas. ? Colonoscopa. Medicamentos Consulte al mdico sobre:  Quarry manager o suspender los medicamentos que toma habitualmente. Esto es muy importante si toma medicamentos para la diabetes o anticoagulantes.  Tomar medicamentos como aspirina e ibuprofeno. Estos medicamentos pueden tener un efecto anticoagulante en la [place]. No tome estos medicamentos a menos que el mdico se lo indique.  Tomar medicamentos de [employer], vitaminas, hierbas y  suplementos. Mantenerse hidratado Siga las indicaciones del mdico acerca de mantenerse hidratado, las cuales pueden incluir lo siguiente:  Hasta 2horas antes del procedimiento, puede beber lquidos transparentes, como agua, jugos de fruta sin pulpa, caf negro y t solo. Comida y bebida Siga las indicaciones del mdico respecto de las comidas y bebidas, las cuales pueden incluir lo siguiente:  Ocho horas antes del procedimiento, no coma alimentos pesados, por ejemplo, carnes, alimentos con alto contenido graso o fritos.  Seis horas antes del procedimiento, deje de ingerir comidas o alimentos livianos, como tostadas o cereales.  6 horas antes del procedimiento, deje de tomar leche o bebidas que [employer].  2 horas antes del procedimiento, deje de beber lquidos transparentes. Instrucciones generales  Pregntele al mdico: ? Cmo se Tax inspector de la ciruga. ? Qu medidas se tomarn para evitar una infeccin. Estas medidas pueden incluir:  Rasurar el vello del lugar de la ciruga.  Lavar la piel con un jabn antisptico.  Suministrar antibiticos.  No consuma ningn producto que contenga nicotina o tabaco como mnimo durante 4 a 6semanas antes del procedimiento. Estos productos incluyen cigarrillos, cigarrillos electrnicos y tabaco para Higher education careers adviser. Si necesita ayuda para dejar de fumar, consulte al mdico. Sander Nephew ocurre durante el procedimiento?   Le colocarn una va intravenosa en una vena.  Le administrarn un medicamento para hacerlo dormir (anestesia general). Tambin le administrarn un medicamento para ayudarlo a relajarse (sedante).  Le harn una o ms incisiones alrededor del estoma para separar el intestino delgado de la piel.  Si hay tejido cicatricial en el abdomen, se lo extirpar.  Se reconectar el intestino delgado al resto del intestino en el interior del abdomen.  Las incisiones se  cerrarn con puntos (suturas), goma para cerrar la piel o tiras  [id number]. Es posible que la zona se Baruch Gouty con una venda (vendaje). El procedimiento puede variar segn el mdico y el hospital. Ladell Heads ocurre despus del procedimiento?  Puede seguir recibiendo lquidos o medicamentos por va intravenosa.  Sentir un poco de Engineer, mining. Pueden suministrarle medicamentos para ayudarlo.  Le controlarn la presin arterial, la frecuencia cardaca, la frecuencia respiratoria y Air cabin crew de oxgeno en la sangre hasta que le den el alta del hospital o la clnica.  Tal vez no pueda tomar lquidos normalmente ni comer alimentos slidos hasta por lo menos 24horas despus del procedimiento. Pueden darle trocitos de hielo para que chupe hasta que est en condiciones de tomar lquidos normalmente.  Quizs [employer] de compresin. Estas medias ayudan a Transport planner formacin de cogulos de [place] y a reducir la hinchazn de las piernas. Resumen  La reversin de la ileostoma es un procedimiento que revierte una ileostoma provisoria.  Antes del procedimiento, siga las instrucciones del mdico con respecto a los medicamentos.  Antes del procedimiento, siga las instrucciones del mdico sobre alimentos y bebidas.  No consuma ningn producto que contenga nicotina o tabaco como mnimo durante 4 a 6semanas antes del procedimiento. Estos productos incluyen cigarrillos, cigarrillos electrnicos y tabaco para Theatre manager. Si necesita ayuda para dejar de fumar, consulte al mdico.  Sentir algo de dolor despus del procedimiento. Pueden suministrarle medicamentos para ayudarlo. Esta informacin no tiene Theme park manager el consejo del mdico. Asegrese de hacerle al mdico cualquier pregunta que tenga. Document Revised: 04/15/2018 Document Reviewed: 04/15/2018 Elsevier Patient Education  2020 [employer].

## 2019-11-24 ENCOUNTER — Telehealth: Payer: Self-pay | Admitting: *Deleted

## 2019-11-24 ENCOUNTER — Other Ambulatory Visit: Payer: Self-pay | Admitting: Emergency Medicine

## 2019-11-24 ENCOUNTER — Telehealth: Payer: Self-pay | Admitting: Surgery

## 2019-11-24 ENCOUNTER — Telehealth: Payer: Self-pay

## 2019-11-24 MED ORDER — BISACODYL 5 MG PO TBEC: DELAYED_RELEASE_TABLET | ORAL | 0 refills | Status: DC

## 2019-11-24 MED ORDER — NEOMYCIN SULFATE 500 MG PO TABS: ORAL_TABLET | ORAL | 0 refills | Status: DC

## 2019-11-24 MED ORDER — ERYTHROMYCIN BASE 500 MG PO TABS: ORAL_TABLET | ORAL | 0 refills | Status: DC

## 2019-11-24 MED ORDER — POLYETHYLENE GLYCOL 3350 17 GM/SCOOP PO POWD: ORAL | 0 refills | Status: DC

## 2019-11-24 NOTE — Telephone Encounter (Signed)
Spoke to [place] with the pharmacy to notify her that as long as the patient received the Neomycin medication that he did not have to have the Erythromycin medication due to the cost being so expensive. Victorino Dike verbalized understanding and states she informed the patient the Neomycin would be restocked tomorrow.

## 2019-11-24 NOTE — Telephone Encounter (Signed)
Alexander Carpenter with [facility] to speak w/patient when he comes in the office today (11/24/19).  She will advise him of Pre-Admission date/time, COVID Testing date and Surgery date.  Surgery Date: 11/29/19 Preadmission Testing Date: 11/25/19 (phone 8a-1p) Covid Testing Date: 11/28/19 - [facility] between 8a-1p   Patient also to call [phone], between 1-3:00pm the day before surgery, to find out what time to arrive for surgery.

## 2019-11-24 NOTE — Telephone Encounter (Signed)
Per Dr.Piscoya asked if we could speak with patient and provide him with the information for the Bowel Prep prior to surgery. Raynelle Fanning spoke with patient due to being unable to speak english. Patient states he will try to find a ride back to the office so Raynelle Fanning can explain the bowel prep information to him since no one in his home speaks english. If patient surgery information is ready. Raynelle Fanning says she will explain that to him as well. Patient verbalized understanding.

## 2019-11-24 NOTE — Telephone Encounter (Signed)
Victorino Dike from [facility] called and stated that they are not able to get erythromycin and plu the patient is paying out of pocket and it is very expensive. [phone]. She stated that maybe we can prescribe something different

## 2019-11-25 ENCOUNTER — Encounter
Admission: RE | Admit: 2019-11-25 | Discharge: 2019-11-25 | Disposition: A | Discharge status: Home / Self Care | Payer: Self-pay | Source: Ambulatory Visit | Attending: Surgery | Admitting: Surgery

## 2019-11-25 ENCOUNTER — Other Ambulatory Visit: Payer: Self-pay

## 2019-11-25 DIAGNOSIS — Z01818 Encounter for other preprocedural examination: Secondary | ICD-10-CM | POA: Insufficient documentation

## 2019-11-25 HISTORY — DX: Gastro-esophageal reflux disease without esophagitis: K21.9

## 2019-11-25 HISTORY — DX: Anemia, unspecified: D64.9

## 2019-11-25 NOTE — Patient Instructions (Addendum)
Your procedure is scheduled on: 11-29-19 TUESDAY Su procedimiento est programado para: Report to [place] a: To find out your arrival time please call [phone] between 1PM - 3PM on Monday 11-28-19 Para saber su hora de llegada por favor llame al ([place]:   Remember: Instructions that are not followed completely may result in serious medical risk, up to and including death,  or upon the discretion of your surgeon and anesthesiologist your surgery may need to be rescheduled.  Recuerde: Las instrucciones que no se siguen completamente Heritage manager en un riesgo de salud grave, incluyendo hasta  la [place] o a discrecin de su cirujano y Environmental health practitioner, su ciruga se puede posponer.   __X_ 1.Do not eat food after midnight the night before your procedure. No    gum chewing or hard candies. You may drink clear liquids up to 2 hours     before you are scheduled to arrive for your surgery- DO not drink clear     Liquids within 2 hours of the start of your surgery.     Clear Liquids include:    water, apple juice without pulp, clear carbohydrate drink such as    Clearfast of Gart[place]ade, Black Coffee or Tea (Do not add anything to coffee or tea).      No coma nada despus de la medianoche de la noche anterior a su    procedimiento. No coma chicles ni caramelos duros. Puede tomar    lquidos claros hasta 2 horas antes de su hora programada de llegada al     hospital para su procedimiento. No tome lquidos claros durante el     transcurso de las 2 horas de su llegada programada al hospital para su     procedimiento, ya que esto puede llevar a que su procedimiento se    retrase o tenga que volver a Health and safety inspector.  Los lquidos claros incluyen:          - Agua o jugo de [place] sin pulpa          - Bebidas claras con carbohidratos como ClearFast o Gatorade          - Caf negro o t claro (sin leche, sin cremas, no agregue  nada al caf ni al t)  No tome nada que no est en esta lista.  Los pacientes con diabetes tipo 1 y tipo 2 solo deben Agricultural engineer.  Llame a la clnica de PreCare o a la unidad de Same Day Surgery si  tiene alguna pregunta sobre estas instrucciones.              _X__ 2.Do Not Smoke or use e-cigarettes For 24 Hours Prior to Your Surgery.    Do not use any chewable tobacco products for at least 6   hours prior to surgery.    No fume ni use cigarrillos electrnicos durante las 24 horas previas    a su [place].  No use ningn producto de tabaco masticable durante   al menos 6 horas antes de la [place].     __X_ 3. No alcohol for 24 hours before or after surgery.    No tome alcohol durante las 24 horas antes ni despus de la [place].   ____4. Bring all medications with you on the day of surgery if instructed.    Lleve todos los medicamentos con usted el da de su ciruga si se le  ha indicado as.   _X___ 5. Notify your doctor if there is any change in your medical condition (cold,fever, infections).    Informe a su mdico si hay algn cambio en su condicin mdica  (resfriado, fiebre, infecciones).   Do not wear jewelry, make-up, hairpins, clips or nail polish.  No use joyas, maquillajes, pinzas/ganchos para el cabello ni esmalte de uas.  Do not wear lotions, powders, or perfumes.  No use lociones, polvos o perfumes.     Do not shave 48 hours prior to surgery. Men may shave face and neck.  No se afeite 48 horas antes de la [place].  Los hombres pueden [employer] cara  y el cuello.   Do not bring valuables to the hospital.   No lleve objetos de valor al hospital.  [facility] is not responsible for any belongings or valuables.  [place] no se hace responsable de ningn tipo de pertenencias u objetos de Licensed conveyancer.               Contacts, dentures or bridgework may not be worn into surgery.  Los lentes de [place], las dentaduras postizas o puentes no se pueden usar en la  [place].   Leave your suitcase in the car. After surgery it may be brought to your room.  Deje su maleta en el auto.  Despus de la ciruga podr traerla a su habitacin.   For patients admitted to the hospital, discharge time is determined by your  treatment team.  Para los pacientes que sean ingresados al hospital, el tiempo en el cual se le  dar de alta es determinado por su equipo de [place].   Patients discharged the day of surgery will not be allowed to drive home. A los pacientes que se les da de alta el mismo da de la ciruga no se les permitir conducir a Higher education careers adviser.   Please read over the following fact sheets that you were given: Por favor lea las siguientes hojas de informacin que le dieron:   SURGERY AND SOAP INSTRUCTIONS   _X___ Take these medicines the morning of surgery with A SIP OF WATER:          [employer] estas medicinas la maana de la ciruga con UN SORBO DE AGUA:  1.PRILOSEC (OMEPRAZOLE)  2.TAKE AN EXTRA PRILOSEC THE NIGHT BEFORE YOUR SURGERY  3. FOLLOW BOWEL PREP INSTRUCTIONS FROM DR PISCOYA'S OFFICE  4.       5.  6.  _X___ Fleet Enema (as directed)-DO FLEET ENEMA THE NIGHT BEFORE YOUR SURGERY AND ANOTHER FLEET ENEMA THE DAY OF SURGERY 1 HOUR PRIOR TO ARRIVAL TIME TO HOSPITAL          Enema de Fleet (segn lo indicado)    _X___ Use CHG Soap as directed          Utilice el jabn de CHG segn lo indicado  ____ Use inhalers on the day of surgery          Use los inhaladores el da de la ciruga  ____ Stop metformin 2 days prior to surgery          Deje de tomar el metformin 2 das antes de la ciruga    ____ Take 1/2 of usual insulin dose the night before surgery and none on the morning of surgery           Tome la mitad de la dosis habitual de insulina la noche antes de la [place] y no tome nada en la [place]  de la             ciruga  ____ Stop Coumadin/Plavix/aspirin on           Deje de tomar el Coumadin/Plavix/aspirina el da:  _X___ Stop  Anti-inflammatories NOW-STOP ADVIL, IBUPROFEN, ALEVE, NAPROXEN. ONLY TAKE TYLENOL IF NEEDED          Deje de tomar antiinflamatorios el da:   ____ Stop supplements until after surgery            Deje de tomar suplementos hasta despus de la ciruga  ____ Bring C-Pap to the hospital          Lleve el C-Pap al hospital

## 2019-11-25 NOTE — Pre-Procedure Instructions (Signed)
Progress Notes - documented in this encounter Tor Netters, MD - 11/18/2019 2:20 PM EDT Formatting of this note is different from the original. Images from the original note were not included.  [place], [place] [address] Phone: [phone] Fax: [phone]   New Consultation Clinic Note  PCP: Referring Provider:  Karmen Bongo, PA [place] [address] Phone: [phone] Fax: [place] [place] [address] [place], [place] [address] Phone: [phone] Fax: [phone]   Date of Service: 11/18/2019  ASSESSMENT and PLAN:   Alexander Carpenter is a 54 y.o. man presenting at the request of Ala Bent, PA for evaluation and management of: 1. Hyperlipidemia, unspecified hyperlipidemia type  2. Abnormal EKG  3. Pre-operative clearance   Alexander Carpenter presents for pre-operative evaluation prior to ileostomy takedown. His PCP noted that his ECG was abnormal. His ECG today shows non-specific TW abnormality. He does have diabetes and HLD, however, he has no symptoms to suggest unstable ischemic heart disease or heart failure. He has good functional capacity. No further diagnostics are needed prior to surgery. Management of hyperlipidemia per PCP.  Return prn.  No orders of the defined types were placed in this encounter.  Tor Netters, MD, PhD [place] Appointments: [phone] Nurse Line: [phone]  MEDICATIONS:   Patient's Medications  New Prescriptions  OMEPRAZOLE (PRILOSEC) 20 MG CAPSULE Take 1 capsule (20 mg total) by mouth daily.  Previous Medications  ATORVASTATIN (LIPITOR) 10 MG TABLET Take 10 mg by mouth daily.  B COMPLEX VITAMINS CAPSULE Take 1 capsule by mouth daily.  SITAGLIPTIN (JANUVIA) 50 MG TABLET Take 50 mg by mouth.  Modified Medications  No  medications on file  Discontinued Medications  No medications on file   SUBJECTIVE:   History of Present Illness: Alexander. Jose Carpenter is a 54 y.o. man with the below cardiovascular and relevant past medical history presenting at the request of Ala Bent, PA for evaluation and management of peri-operative cardiac risk.  Cardiovascular History:  Needs surgery and PCP's ECG was read as "abnormal."  Interval History:  Alexander Carpenter presents with his brother. This visit was conducted with the assistance of an interpretor.  He says he feels well unless his "ulcer" is causing problems. He was told in the past that he had an ulcer but doesn't know where. It has been acting up lately because he has not been careful with triggers recently: drinking coffee, tequila, eating hot chiles, coke. He describes his ulcer pain as a sharp discomfort starting from his throat down to his stomach. He was on ranitidine which helped a lot but stopped it last year when it was taken off the market (contaminant which is a carcinogen).   He has fairly good functional capacity. He walks to his local store often which is 1.5-47miles each way. He does yard work. He feels a little SOB if he has to walk up a hill/incline, but feels like that's his normal.  Review of Systems 10 systems were reviewed and negative except as noted in HPI.  Cardiovascular History and Relevant PMH:  Past Medical History:  Diagnosis Date  . Bowel perforation (CMS-HCC) 06/2018  . Hyperlipidemia    Cardiovascular Studies Date Comments  ECG 11/18/19 SR, non-specific TW abnormality, baseline artifact  Echo  Stress test  Cardiac catheterization  [id number] Genotype  Electrophysiology  Cardiovascular  Surgery  Peripheral Vascular Studies  Anginal Equivalent  Other Imaging   Allergies: has No Known Allergies.  Social History: He reports that he has never smoked. He has never used smokeless tobacco. He reports current alcohol  use of about 6.0 standard drinks of alcohol per week. He reports current drug use. Drug: Marijuana.  Family History: His family history includes Diabetes in his brother; No Known Problems in his father and mother.   OBJECTIVE:   BP 127/72 (BP Site: R Arm, BP Position: Sitting, BP Cuff Size: Medium)  Pulse 89  Temp 37 C (98.6 F) (Temporal)  Wt 73.9 kg (162 lb 14.4 oz)  SpO2 95%  Wt Readings from Last 3 Encounters:  11/18/19 73.9 kg (162 lb 14.4 oz)   BP Readings from Last 5 Encounters:  11/18/19 127/72   General: Alert, no distress.  HEENT: EOMI, sclerae anicteric  Neck: No carotid bruit. JVP is not visible sitting upright  Lungs: CTAB bilaterally with normal WOB.  CV: RRR, normal s1 and s2, no murmurs  Abdomen: Soft, non-tender, +colostomy  Extremities: No LE edema  Skin: No lesions/rashes.  Neurologic: No focal deficits.   Most recent labs  No results found for: HGB, PLT, INR  No results found for: CREATININE, K, NA, PROBNP  No results found for: CHOL, LDL, HDL, TRIG    Electronically signed by Lorretta Harp, MD at 11/19/2019 3:05 PM EDT   Plan of Treatment - documented as of this encounter Not on file   Visit Diagnoses - documented in this encounter

## 2019-11-28 ENCOUNTER — Other Ambulatory Visit
Admission: RE | Admit: 2019-11-28 | Discharge: 2019-11-28 | Disposition: A | Discharge status: Home / Self Care | Payer: Self-pay | Source: Ambulatory Visit | Attending: Surgery | Admitting: Surgery

## 2019-11-28 ENCOUNTER — Other Ambulatory Visit: Payer: Self-pay

## 2019-11-28 DIAGNOSIS — Z20822 Contact with and (suspected) exposure to covid-19: Secondary | ICD-10-CM | POA: Insufficient documentation

## 2019-11-28 DIAGNOSIS — Z01812 Encounter for preprocedural laboratory examination: Secondary | ICD-10-CM | POA: Insufficient documentation

## 2019-11-28 LAB — CBC
HCT: 44 % (ref 39.0–52.0)
Hemoglobin: 14.7 g/dL (ref 13.0–17.0)
MCH: 29.3 pg (ref 26.0–34.0)
MCHC: 33.4 g/dL (ref 30.0–36.0)
MCV: 87.8 fL (ref 80.0–100.0)
Platelets: 251 10*3/uL (ref 150–400)
RBC: 5.01 MIL/uL (ref 4.22–5.81)
RDW: 12.9 % (ref 11.5–15.5)
WBC: 5.7 10*3/uL (ref 4.0–10.5)
nRBC: 0 % (ref 0.0–0.2)

## 2019-11-28 LAB — TYPE AND SCREEN
ABO/RH(D): A POS
Antibody Screen: NEGATIVE

## 2019-11-28 LAB — BASIC METABOLIC PANEL
Anion gap: 10 (ref 5–15)
BUN: 17 mg/dL (ref 6–20)
CO2: 25 mmol/L (ref 22–32)
Calcium: 8.7 mg/dL — ABNORMAL LOW (ref 8.9–10.3)
Chloride: 100 mmol/L (ref 98–111)
Creatinine, Ser: 0.92 mg/dL (ref 0.61–1.24)
GFR calc Af Amer: >60 mL/min (ref >60)
GFR calc non Af Amer: >60 mL/min (ref >60)
Glucose, Bld: 236 mg/dL — ABNORMAL HIGH (ref 70–99)
Potassium: 3.9 mmol/L (ref 3.5–5.1)
Sodium: 135 mmol/L (ref 135–145)

## 2019-11-28 LAB — SARS CORONAVIRUS 2 (TAT 6-24 HRS): SARS Coronavirus 2: NEGATIVE

## 2019-11-28 MED ORDER — SODIUM CHLORIDE 0.9 % IV SOLN
2.0000 g | INTRAVENOUS | Status: AC
  Administered 2019-11-29: 08:00:00 2 g via INTRAVENOUS

## 2019-11-28 NOTE — Pre-Procedure Instructions (Addendum)
REGARDING CARDIOLOGY CLEARANCE PER ANESTHESIA  Pre-Admit Testing Provider Communication Note  Provider: Dr. Karlton Lemon  Communication Mode: Secure Chat  Reason: Clarification  Response:    Additional Information: Noted. Placed on Chart.  Signed: Alvester Morin, RN

## 2019-11-29 ENCOUNTER — Inpatient Hospital Stay
Admission: RE | Admit: 2019-11-29 | Discharge: 2019-12-09 | DRG: 330 | Disposition: A | Discharge status: Home / Self Care | Payer: Self-pay | Attending: Surgery | Admitting: Surgery

## 2019-11-29 ENCOUNTER — Other Ambulatory Visit: Payer: Self-pay

## 2019-11-29 ENCOUNTER — Encounter
Admission: RE | Disposition: A | Discharge status: Home / Self Care | Payer: Self-pay | Source: Home / Self Care | Attending: Surgery

## 2019-11-29 ENCOUNTER — Inpatient Hospital Stay: Payer: Self-pay | Admitting: Anesthesiology

## 2019-11-29 ENCOUNTER — Encounter: Payer: Self-pay | Admitting: Surgery

## 2019-11-29 DIAGNOSIS — J95812 Postprocedural air leak: Secondary | ICD-10-CM | POA: Diagnosis not present

## 2019-11-29 DIAGNOSIS — K9189 Other postprocedural complications and disorders of digestive system: Secondary | ICD-10-CM

## 2019-11-29 DIAGNOSIS — D72829 Elevated white blood cell count, unspecified: Secondary | ICD-10-CM | POA: Diagnosis not present

## 2019-11-29 DIAGNOSIS — Z7984 Long term (current) use of oral hypoglycemic drugs: Secondary | ICD-10-CM

## 2019-11-29 DIAGNOSIS — E119 Type 2 diabetes mellitus without complications: Secondary | ICD-10-CM

## 2019-11-29 DIAGNOSIS — Z79899 Other long term (current) drug therapy: Secondary | ICD-10-CM

## 2019-11-29 DIAGNOSIS — Z932 Ileostomy status: Secondary | ICD-10-CM

## 2019-11-29 DIAGNOSIS — G9341 Metabolic encephalopathy: Secondary | ICD-10-CM

## 2019-11-29 DIAGNOSIS — E785 Hyperlipidemia, unspecified: Secondary | ICD-10-CM | POA: Diagnosis present

## 2019-11-29 DIAGNOSIS — K435 Parastomal hernia without obstruction or  gangrene: Secondary | ICD-10-CM | POA: Diagnosis present

## 2019-11-29 DIAGNOSIS — F17210 Nicotine dependence, cigarettes, uncomplicated: Secondary | ICD-10-CM | POA: Diagnosis present

## 2019-11-29 DIAGNOSIS — Z833 Family history of diabetes mellitus: Secondary | ICD-10-CM

## 2019-11-29 DIAGNOSIS — Z20822 Contact with and (suspected) exposure to covid-19: Secondary | ICD-10-CM | POA: Diagnosis present

## 2019-11-29 DIAGNOSIS — R9431 Abnormal electrocardiogram [ECG] [EKG]: Secondary | ICD-10-CM

## 2019-11-29 DIAGNOSIS — Y92230 Patient room in hospital as the place of occurrence of the external cause: Secondary | ICD-10-CM | POA: Diagnosis not present

## 2019-11-29 DIAGNOSIS — N179 Acute kidney failure, unspecified: Secondary | ICD-10-CM | POA: Diagnosis not present

## 2019-11-29 DIAGNOSIS — K0889 Other specified disorders of teeth and supporting structures: Secondary | ICD-10-CM | POA: Diagnosis present

## 2019-11-29 DIAGNOSIS — Z433 Encounter for attention to colostomy: Secondary | ICD-10-CM

## 2019-11-29 DIAGNOSIS — R140 Abdominal distension (gaseous): Secondary | ICD-10-CM

## 2019-11-29 DIAGNOSIS — I517 Cardiomegaly: Secondary | ICD-10-CM | POA: Diagnosis present

## 2019-11-29 DIAGNOSIS — Z9049 Acquired absence of other specified parts of digestive tract: Secondary | ICD-10-CM

## 2019-11-29 DIAGNOSIS — K567 Ileus, unspecified: Secondary | ICD-10-CM | POA: Diagnosis not present

## 2019-11-29 DIAGNOSIS — Z432 Encounter for attention to ileostomy: Principal | ICD-10-CM

## 2019-11-29 DIAGNOSIS — Y832 Surgical operation with anastomosis, bypass or graft as the cause of abnormal reaction of the patient, or of later complication, without mention of misadventure at the time of the procedure: Secondary | ICD-10-CM | POA: Diagnosis not present

## 2019-11-29 DIAGNOSIS — E876 Hypokalemia: Secondary | ICD-10-CM | POA: Diagnosis not present

## 2019-11-29 DIAGNOSIS — K660 Peritoneal adhesions (postprocedural) (postinfection): Secondary | ICD-10-CM | POA: Diagnosis present

## 2019-11-29 DIAGNOSIS — R000 Tachycardia, unspecified: Secondary | ICD-10-CM

## 2019-11-29 DIAGNOSIS — K429 Umbilical hernia without obstruction or gangrene: Secondary | ICD-10-CM | POA: Diagnosis present

## 2019-11-29 DIAGNOSIS — Z8719 Personal history of other diseases of the digestive system: Secondary | ICD-10-CM

## 2019-11-29 DIAGNOSIS — K219 Gastro-esophageal reflux disease without esophagitis: Secondary | ICD-10-CM | POA: Diagnosis present

## 2019-11-29 DIAGNOSIS — Z933 Colostomy status: Secondary | ICD-10-CM

## 2019-11-29 DIAGNOSIS — K432 Incisional hernia without obstruction or gangrene: Secondary | ICD-10-CM | POA: Diagnosis present

## 2019-11-29 DIAGNOSIS — I10 Essential (primary) hypertension: Secondary | ICD-10-CM

## 2019-11-29 HISTORY — PX: PARASTOMAL HERNIA REPAIR: SHX2162

## 2019-11-29 HISTORY — PX: UMBILICAL HERNIA REPAIR: SHX196

## 2019-11-29 HISTORY — PX: COLOSTOMY REVERSAL: SHX5782

## 2019-11-29 HISTORY — PX: ILEOSTOMY CLOSURE: SHX1784

## 2019-11-29 LAB — GLUCOSE, CAPILLARY
Glucose-Capillary: 163 mg/dL — ABNORMAL HIGH (ref 70–99)
Glucose-Capillary: 211 mg/dL — ABNORMAL HIGH (ref 70–99)
Glucose-Capillary: 248 mg/dL — ABNORMAL HIGH (ref 70–99)

## 2019-11-29 SURGERY — CLOSURE, ILEOSTOMY
Anesthesia: General

## 2019-11-29 MED ORDER — POLYETHYLENE GLYCOL 3350 17 G PO PACK: 17.0000 g | PACK | Freq: Every day | ORAL | Status: DC | PRN

## 2019-11-29 MED ORDER — BUPIVACAINE LIPOSOME 1.3 % IJ SUSP
INTRAMUSCULAR | Status: AC
  Filled 2019-11-29: qty 20

## 2019-11-29 MED ORDER — FENTANYL CITRATE (PF) 100 MCG/2ML IJ SOLN
INTRAMUSCULAR | Status: DC | PRN
  Administered 2019-11-29: 100 ug via INTRAVENOUS
  Administered 2019-11-29: 25 ug via INTRAVENOUS
  Administered 2019-11-29 (×2): 50 ug via INTRAVENOUS
  Administered 2019-11-29: 25 ug via INTRAVENOUS

## 2019-11-29 MED ORDER — SODIUM CHLORIDE 0.9 % IV SOLN
INTRAVENOUS | Status: DC | PRN
  Administered 2019-11-29: 13:00:00 50 mL

## 2019-11-29 MED ORDER — MIDAZOLAM HCL 2 MG/2ML IJ SOLN
INTRAMUSCULAR | Status: AC
  Filled 2019-11-29: qty 2

## 2019-11-29 MED ORDER — ALVIMOPAN 12 MG PO CAPS
ORAL_CAPSULE | ORAL | Status: AC
  Administered 2019-11-29: 12 mg via ORAL
  Filled 2019-11-29: qty 1

## 2019-11-29 MED ORDER — OXYCODONE HCL 5 MG/5ML PO SOLN: 5.0000 mg | Freq: Once | ORAL | Status: DC | PRN

## 2019-11-29 MED ORDER — ROCURONIUM BROMIDE 10 MG/ML (PF) SYRINGE
PREFILLED_SYRINGE | INTRAVENOUS | Status: AC
  Filled 2019-11-29: qty 10

## 2019-11-29 MED ORDER — HYDROMORPHONE HCL 1 MG/ML IJ SOLN
0.5000 mg | INTRAMUSCULAR | Status: DC | PRN
  Administered 2019-12-02 – 2019-12-03 (×2): 0.5 mg via INTRAVENOUS
  Filled 2019-11-29: qty 1
  Filled 2019-11-29: qty 0.5

## 2019-11-29 MED ORDER — OXYCODONE HCL 5 MG PO TABS: 5.0000 mg | ORAL_TABLET | ORAL | Status: DC | PRN

## 2019-11-29 MED ORDER — SODIUM CHLORIDE FLUSH 0.9 % IV SOLN
INTRAVENOUS | Status: AC
  Filled 2019-11-29: qty 10

## 2019-11-29 MED ORDER — ACETAMINOPHEN 500 MG PO TABS
ORAL_TABLET | ORAL | Status: AC
  Administered 2019-11-29: 1000 mg via ORAL
  Filled 2019-11-29: qty 2

## 2019-11-29 MED ORDER — PANTOPRAZOLE SODIUM 40 MG IV SOLR
40.0000 mg | Freq: Every day | INTRAVENOUS | Status: DC
  Administered 2019-11-29 – 2019-12-01 (×3): 40 mg via INTRAVENOUS
  Filled 2019-11-29 (×3): qty 40

## 2019-11-29 MED ORDER — BUPIVACAINE-EPINEPHRINE (PF) 0.5% -1:200000 IJ SOLN
INTRAMUSCULAR | Status: DC | PRN
  Administered 2019-11-29: 30 mL

## 2019-11-29 MED ORDER — GABAPENTIN 300 MG PO CAPS: 300.0000 mg | ORAL_CAPSULE | ORAL | Status: AC

## 2019-11-29 MED ORDER — CHLORHEXIDINE GLUCONATE CLOTH 2 % EX PADS: 6.0000 | MEDICATED_PAD | Freq: Once | CUTANEOUS | Status: DC

## 2019-11-29 MED ORDER — GABAPENTIN 300 MG PO CAPS
ORAL_CAPSULE | ORAL | Status: AC
  Administered 2019-11-29: 300 mg via ORAL
  Filled 2019-11-29: qty 1

## 2019-11-29 MED ORDER — DEXAMETHASONE SODIUM PHOSPHATE 10 MG/ML IJ SOLN
INTRAMUSCULAR | Status: DC | PRN
  Administered 2019-11-29: 10 mg via INTRAVENOUS

## 2019-11-29 MED ORDER — FENTANYL CITRATE (PF) 100 MCG/2ML IJ SOLN
INTRAMUSCULAR | Status: AC
  Filled 2019-11-29: qty 2

## 2019-11-29 MED ORDER — ROCURONIUM BROMIDE 100 MG/10ML IV SOLN
INTRAVENOUS | Status: DC | PRN
  Administered 2019-11-29: 20 mg via INTRAVENOUS
  Administered 2019-11-29: 30 mg via INTRAVENOUS
  Administered 2019-11-29: 20 mg via INTRAVENOUS
  Administered 2019-11-29: 50 mg via INTRAVENOUS
  Administered 2019-11-29 (×5): 20 mg via INTRAVENOUS

## 2019-11-29 MED ORDER — INSULIN ASPART 100 UNIT/ML ~~LOC~~ SOLN
0.0000 [IU] | SUBCUTANEOUS | Status: DC
  Administered 2019-11-29: 5 [IU] via SUBCUTANEOUS
  Administered 2019-11-30 (×2): 2 [IU] via SUBCUTANEOUS
  Administered 2019-11-30 (×3): 3 [IU] via SUBCUTANEOUS
  Administered 2019-12-01: 2 [IU] via SUBCUTANEOUS
  Filled 2019-11-29 (×8): qty 1

## 2019-11-29 MED ORDER — KETAMINE HCL 50 MG/ML IJ SOLN
INTRAMUSCULAR | Status: AC
  Filled 2019-11-29: qty 10

## 2019-11-29 MED ORDER — FLEET ENEMA 7-19 GM/118ML RE ENEM: 1.0000 | ENEMA | Freq: Once | RECTAL | Status: DC

## 2019-11-29 MED ORDER — LIDOCAINE HCL (CARDIAC) PF 100 MG/5ML IV SOSY
PREFILLED_SYRINGE | INTRAVENOUS | Status: DC | PRN
  Administered 2019-11-29: 60 mg via INTRAVENOUS
  Administered 2019-11-29: 100 mg via INTRAVENOUS
  Administered 2019-11-29: 40 mg via INTRAVENOUS
  Administered 2019-11-29: 100 mg via INTRAVENOUS

## 2019-11-29 MED ORDER — LIDOCAINE HCL (PF) 2 % IJ SOLN
INTRAMUSCULAR | Status: AC
  Filled 2019-11-29: qty 5

## 2019-11-29 MED ORDER — ALVIMOPAN 12 MG PO CAPS: 12.0000 mg | ORAL_CAPSULE | ORAL | Status: AC

## 2019-11-29 MED ORDER — SUGAMMADEX SODIUM 200 MG/2ML IV SOLN
INTRAVENOUS | Status: DC | PRN
  Administered 2019-11-29: 150 mg via INTRAVENOUS

## 2019-11-29 MED ORDER — ONDANSETRON 4 MG PO TBDP
4.0000 mg | ORAL_TABLET | Freq: Four times a day (QID) | ORAL | Status: DC | PRN
  Filled 2019-11-29: qty 1

## 2019-11-29 MED ORDER — SODIUM CHLORIDE 0.9 % IV SOLN
2.0000 g | Freq: Three times a day (TID) | INTRAVENOUS | Status: AC
  Administered 2019-11-29 – 2019-11-30 (×2): 2 g via INTRAVENOUS
  Filled 2019-11-29 (×2): qty 2

## 2019-11-29 MED ORDER — ENOXAPARIN SODIUM 40 MG/0.4ML ~~LOC~~ SOLN
40.0000 mg | SUBCUTANEOUS | Status: DC
  Administered 2019-11-30 – 2019-12-09 (×10): 40 mg via SUBCUTANEOUS
  Filled 2019-11-29 (×10): qty 0.4

## 2019-11-29 MED ORDER — PHENYLEPHRINE HCL (PRESSORS) 10 MG/ML IV SOLN
INTRAVENOUS | Status: DC | PRN
  Administered 2019-11-29 (×5): 100 ug via INTRAVENOUS

## 2019-11-29 MED ORDER — OXYCODONE HCL 5 MG PO TABS: 5.0000 mg | ORAL_TABLET | Freq: Once | ORAL | Status: DC | PRN

## 2019-11-29 MED ORDER — ONDANSETRON HCL 4 MG/2ML IJ SOLN
4.0000 mg | Freq: Four times a day (QID) | INTRAMUSCULAR | Status: DC | PRN
  Administered 2019-12-04: 4 mg via INTRAVENOUS
  Filled 2019-11-29 (×2): qty 2

## 2019-11-29 MED ORDER — EPHEDRINE SULFATE 50 MG/ML IJ SOLN
INTRAMUSCULAR | Status: DC | PRN
  Administered 2019-11-29: 5 mg via INTRAVENOUS
  Administered 2019-11-29 (×2): 10 mg via INTRAVENOUS

## 2019-11-29 MED ORDER — MIDAZOLAM HCL 2 MG/2ML IJ SOLN
INTRAMUSCULAR | Status: DC | PRN
  Administered 2019-11-29: 2 mg via INTRAVENOUS

## 2019-11-29 MED ORDER — FENTANYL CITRATE (PF) 100 MCG/2ML IJ SOLN
INTRAMUSCULAR | Status: AC
  Administered 2019-11-29: 25 ug via INTRAVENOUS
  Filled 2019-11-29: qty 2

## 2019-11-29 MED ORDER — BUPIVACAINE-EPINEPHRINE (PF) 0.5% -1:200000 IJ SOLN
INTRAMUSCULAR | Status: AC
  Filled 2019-11-29: qty 30

## 2019-11-29 MED ORDER — FENTANYL CITRATE (PF) 100 MCG/2ML IJ SOLN
25.0000 ug | INTRAMUSCULAR | Status: DC | PRN
  Administered 2019-11-29 (×2): 25 ug via INTRAVENOUS

## 2019-11-29 MED ORDER — ONDANSETRON HCL 4 MG/2ML IJ SOLN
INTRAMUSCULAR | Status: DC | PRN
  Administered 2019-11-29: 4 mg via INTRAVENOUS

## 2019-11-29 MED ORDER — CHLORHEXIDINE GLUCONATE CLOTH 2 % EX PADS
6.0000 | MEDICATED_PAD | Freq: Every day | CUTANEOUS | Status: DC
  Administered 2019-11-29: 6 via TOPICAL

## 2019-11-29 MED ORDER — PROPOFOL 10 MG/ML IV BOLUS
INTRAVENOUS | Status: AC
  Filled 2019-11-29: qty 20

## 2019-11-29 MED ORDER — BUPIVACAINE LIPOSOME 1.3 % IJ SUSP: 20.0000 mL | Freq: Once | INTRAMUSCULAR | Status: DC

## 2019-11-29 MED ORDER — SODIUM CHLORIDE 0.9 % IV SOLN
INTRAVENOUS | Status: AC
  Administered 2019-11-29: 2 g via INTRAVENOUS
  Filled 2019-11-29: qty 2

## 2019-11-29 MED ORDER — ACETAMINOPHEN 500 MG PO TABS
1000.0000 mg | ORAL_TABLET | Freq: Four times a day (QID) | ORAL | Status: DC
  Administered 2019-11-29 – 2019-12-09 (×24): 1000 mg via ORAL
  Filled 2019-11-29 (×29): qty 2

## 2019-11-29 MED ORDER — ACETAMINOPHEN 500 MG PO TABS: 1000.0000 mg | ORAL_TABLET | ORAL | Status: AC

## 2019-11-29 MED ORDER — SODIUM CHLORIDE 0.9 % IV SOLN
INTRAVENOUS | Status: AC
  Filled 2019-11-29: qty 2

## 2019-11-29 MED ORDER — SODIUM CHLORIDE 0.9 % IV SOLN: INTRAVENOUS | Status: DC

## 2019-11-29 MED ORDER — LACTATED RINGERS IV SOLN: INTRAVENOUS | Status: DC

## 2019-11-29 MED ORDER — ATORVASTATIN CALCIUM 10 MG PO TABS
10.0000 mg | ORAL_TABLET | Freq: Every day | ORAL | Status: DC
  Administered 2019-11-29 – 2019-12-02 (×4): 10 mg via ORAL
  Filled 2019-11-29 (×4): qty 1

## 2019-11-29 MED ORDER — KETOROLAC TROMETHAMINE 30 MG/ML IJ SOLN
30.0000 mg | Freq: Four times a day (QID) | INTRAMUSCULAR | Status: DC
  Administered 2019-11-29 – 2019-12-03 (×15): 30 mg via INTRAVENOUS
  Filled 2019-11-29 (×15): qty 1

## 2019-11-29 MED ORDER — KETAMINE HCL 50 MG/ML IJ SOLN
INTRAMUSCULAR | Status: DC | PRN
Start: 2019-11-29 — End: 2019-11-29
  Administered 2019-11-29: 50 mg via INTRAMUSCULAR

## 2019-11-29 MED ORDER — PROPOFOL 10 MG/ML IV BOLUS
INTRAVENOUS | Status: DC | PRN
  Administered 2019-11-29: 120 mg via INTRAVENOUS

## 2019-11-29 SURGICAL SUPPLY — 77 items
APPLIER CLIP 11 MED OPEN (CLIP)
APPLIER CLIP 13 LRG OPEN (CLIP)
BLADE CLIPPER SURG (BLADE) ×4 IMPLANT
BNDG CONFORM 2 STRL LF (GAUZE/BANDAGES/DRESSINGS) IMPLANT
BRUSH SCRUB EZ  4% CHG (MISCELLANEOUS)
BRUSH SCRUB EZ 4% CHG (MISCELLANEOUS) IMPLANT
CANISTER SUCT 1200ML W/VALVE (MISCELLANEOUS) ×4 IMPLANT
CATH URET ROBINSON 16FR STRL (CATHETERS) IMPLANT
CHLORAPREP W/TINT 26 (MISCELLANEOUS) ×4 IMPLANT
CLIP APPLIE 11 MED OPEN (CLIP) IMPLANT
CLIP APPLIE 13 LRG OPEN (CLIP) IMPLANT
COVER WAND RF STERILE (DRAPES) ×4 IMPLANT
DRAIN PENROSE 1/4X12 LTX STRL (WOUND CARE) ×4 IMPLANT
DRAPE LAPAROTOMY 100X77 ABD (DRAPES) ×4 IMPLANT
DRAPE LEGGINS SURG 28X43 STRL (DRAPES) ×4 IMPLANT
DRAPE UNDER BUTTOCK W/FLU (DRAPES) ×4 IMPLANT
DRSG OPSITE POSTOP 4X10 (GAUZE/BANDAGES/DRESSINGS) IMPLANT
DRSG OPSITE POSTOP 4X12 (GAUZE/BANDAGES/DRESSINGS) ×4 IMPLANT
DRSG OPSITE POSTOP 4X6 (GAUZE/BANDAGES/DRESSINGS) IMPLANT
DRSG OPSITE POSTOP 4X8 (GAUZE/BANDAGES/DRESSINGS) IMPLANT
DRSG TELFA 3X8 NADH (GAUZE/BANDAGES/DRESSINGS) IMPLANT
DRSG TELFA 4X3 1S NADH ST (GAUZE/BANDAGES/DRESSINGS) IMPLANT
ELECT BLADE 6.5 EXT (BLADE) ×4 IMPLANT
ELECT CAUTERY BLADE 6.4 (BLADE) ×4 IMPLANT
ELECT REM PT RETURN 9FT ADLT (ELECTROSURGICAL) ×4
ELECTRODE REM PT RTRN 9FT ADLT (ELECTROSURGICAL) ×2 IMPLANT
ETHICON CURVED INTRALUMINAL STAPLER 25 MM ×4 IMPLANT
GAUZE SPONGE 4X4 12PLY STRL (GAUZE/BANDAGES/DRESSINGS) ×4 IMPLANT
GLOVE SURG SYN 7.0 (GLOVE) ×20 IMPLANT
GLOVE SURG SYN 7.5  E (GLOVE) ×10
GLOVE SURG SYN 7.5 E (GLOVE) ×10 IMPLANT
GOWN STRL REUS W/ TWL LRG LVL3 (GOWN DISPOSABLE) ×8 IMPLANT
GOWN STRL REUS W/ TWL XL LVL3 (GOWN DISPOSABLE) ×2 IMPLANT
GOWN STRL REUS W/TWL LRG LVL3 (GOWN DISPOSABLE) ×8
GOWN STRL REUS W/TWL XL LVL3 (GOWN DISPOSABLE) ×2
HANDLE SUCTION POOLE (INSTRUMENTS) ×2 IMPLANT
HOLDER FOLEY CATH W/STRAP (MISCELLANEOUS) ×4 IMPLANT
JELLY LUB 2OZ STRL (MISCELLANEOUS) ×2
JELLY LUBE 2OZ STRL (MISCELLANEOUS) ×2 IMPLANT
KIT TURNOVER CYSTO (KITS) ×4 IMPLANT
LABEL OR SOLS (LABEL) ×4 IMPLANT
LIGASURE IMPACT 36 18CM CVD LR (INSTRUMENTS) IMPLANT
NEEDLE HYPO 22GX1.5 SAFETY (NEEDLE) ×4 IMPLANT
NS IRRIG 1000ML POUR BTL (IV SOLUTION) ×4 IMPLANT
PACK BASIN MAJOR (MISCELLANEOUS) ×4 IMPLANT
PACK COLON CLEAN CLOSURE (MISCELLANEOUS) ×4 IMPLANT
PAD ABD DERMACEA PRESS 5X9 (GAUZE/BANDAGES/DRESSINGS) IMPLANT
RELOAD LINEAR CUT PROX 55 BLUE (ENDOMECHANICALS) IMPLANT
RELOAD PROXIMATE 75MM BLUE (ENDOMECHANICALS) ×8 IMPLANT
SOL PREP PVP 2OZ (MISCELLANEOUS) ×4
SOLUTION PREP PVP 2OZ (MISCELLANEOUS) ×2 IMPLANT
SPONGE LAP 18X18 RF (DISPOSABLE) ×8 IMPLANT
STAPLER PROXIMATE 55 BLUE (STAPLE) IMPLANT
STAPLER PROXIMATE 75MM BLUE (STAPLE) ×4 IMPLANT
STAPLER SKIN PROX 35W (STAPLE) ×4 IMPLANT
SUCTION POOLE HANDLE (INSTRUMENTS) ×4
SURGILUBE 2OZ TUBE FLIPTOP (MISCELLANEOUS) ×4 IMPLANT
SUT PDS AB 1 CT1 36 (SUTURE) ×12 IMPLANT
SUT PDS PLUS 0 (SUTURE) ×4
SUT PDS PLUS AB 0 CT-2 (SUTURE) ×4 IMPLANT
SUT PROLENE 2 0 KS (SUTURE) IMPLANT
SUT PROLENE 2 0 SH DA (SUTURE) ×4 IMPLANT
SUT SILK 2 0 (SUTURE) ×2
SUT SILK 2 0 SH (SUTURE) ×4 IMPLANT
SUT SILK 2 0 SH CR/8 (SUTURE) IMPLANT
SUT SILK 2 0SH CR/8 30 (SUTURE) ×4 IMPLANT
SUT SILK 2-0 18XBRD TIE 12 (SUTURE) ×2 IMPLANT
SUT SILK 3-0 (SUTURE) ×20 IMPLANT
SUT VIC AB 2-0 SH 27 (SUTURE)
SUT VIC AB 2-0 SH 27XBRD (SUTURE) IMPLANT
SUT VIC AB 3-0 SH 27 (SUTURE)
SUT VIC AB 3-0 SH 27X BRD (SUTURE) IMPLANT
SYR 10ML LL (SYRINGE) ×4 IMPLANT
SYR 20ML LL LF (SYRINGE) ×8 IMPLANT
SYR BULB IRRIG 60ML STRL (SYRINGE) ×4 IMPLANT
SYRINGE IRR TOOMEY STRL 70CC (SYRINGE) ×4 IMPLANT
TRAY FOLEY MTR SLVR 16FR STAT (SET/KITS/TRAYS/PACK) ×4 IMPLANT

## 2019-11-29 NOTE — Transfer of Care (Signed)
Immediate Anesthesia Transfer of Care Note  Patient: Alexander Carpenter  Procedure(s) Performed: ILEOSTOMY TAKEDOWN (N/A ) COLOSTOMY REVERSAL (N/A ) HERNIA REPAIR PARASTOMAL (N/A ) HERNIA REPAIR UMBILICAL ADULT  Patient Location: PACU  Anesthesia Type:General  Level of Consciousness: drowsy and patient cooperative  Airway & Oxygen Therapy: Patient Spontanous Breathing and Patient connected to face mask oxygen  Post-op Assessment: Report given to RN and Post -op Vital signs reviewed and stable  Post vital signs: Reviewed and stable  Last Vitals:  Vitals Value Taken Time  BP 116/77 11/29/19 1434  Temp    Pulse 87 11/29/19 1434  Resp 17 11/29/19 1434  SpO2 100 % 11/29/19 1434  Vitals shown include unvalidated device data.  Last Pain:  Vitals:   11/29/19 0624  TempSrc:   PainSc: 0-No pain         Complications: No apparent anesthesia complications

## 2019-11-29 NOTE — Op Note (Signed)
Procedure Date:  11/29/2019  Pre-operative Diagnosis:  Ileostomy in place, colostomy in place  Post-operative Diagnosis: Ileostomy in place, colostomy in place, incisional umbilical hernia, left parastomal hernia.  Procedure:  Ileostomy takedown with ileocolonic anastomosis, colostomy takedown with colorectal anastomosis, splenic flexure mobilization, repair of left parastomal incisional hernia, repair of incisional umbilical hernia, rigid sigmoidoscopy, debridement of skin and subcutaneous tissue for 20 cm x 2 cm.  Surgeon:  [name], MD  Assistant:   Nestor Lewandowsky, MD.  His assistance was critical given the complexity of the procedure, for lysis of adhesions, both anastomosis as well as testing of them.  Also assisting, Luella Cook, PA-S  Anesthesia:  General endotracheal  Estimated Blood Loss:  50 ml  Specimens: 1. End Ileostomy 2. End colostomy  Complications:  None  Indications for Procedure:  This is a 54 y.o. male with a prior history of severe sigmoid diverticulitis resulting in cecal perforation, requiring exlap, sigmoidectomy, right colectomy, with creation of an end ileostomy and end colostomy.  He now presents for ileostomy and colostomy takedown.  Colonoscopy did not reveal any other issues that would preclude surgery. The risks of bleeding, infection, bowel injury, need for diverting ostomy, and need for further procedures were all discussed with the patient and he was willing to proceed.   Description of Procedure: The patient was correctly identified in the preoperative area and brought into the operating room.  The patient was placed supine with VTE prophylaxis in place.  Appropriate time-outs were performed.  Anesthesia was induced and the patient was intubated.  Appropriate antibiotics were infused.  The patient was then placed in lithotomy position.   The end colostomy and end ileostomy were sutured closed with a 2-0 Silk suture.  The abdomen was prepped and  draped in a sterile fashion.  The patient's previous scar was excised, measuring approximately 20 cm in length x 2 cm width.  Cautery was then used to dissect down the subcutaneous tissue to the fascia.  The fascia was entered carefully and the incision was then extended both inferiorly and superiorly along the midline.  Careful attention was given to omentum that was adhered to the midline.  We then proceeded with extensive lysis of adhesions, which consisted of adhesions of the omentum to the abdominal wall, the omentum to the colon and small bowel, between bowel loops, as well as bowel to the abdominal sidewalls.  This was done both bluntly and with cautery and metz.  Once all the adhesions were taken down, we proceeded to dissect the mucocutaneous junction of the end ileostomy, freeing it from the underlying fascia.  It was then brought into the abdominal cavity.  The proximal end of the transverse colon was identified, and after dissecting some adhesions, we lined the two side by side for our anastomosis.  Enterotomies were created on both limbs and a GIA linear 75 mm blue load was fired to create our common channel.  We then used another load to close the enterotomy, resecting with it the distal end of the end ileostomy.  This was sent to pathology.  The mesenteric defect was closed using 3-0 Silk, and the staple line was imbricated with 3-0 Silk Lemberts.    Attention was then brought to the end colostomy.  The mucocutaneous junction was dissected as well, and the ostomy was freed from the fascia and brought into the abdominal cavity.  The splenic flexure was then mobilized in order to get better length for our colorectal anastomosis.  There was some bleeding from attachment points to the spleen, which were controlled with cautery.  The omentum was separated from the distal transverse colon, and the mesentery of the sigmoid was freed from the retroperitoneum.  This allowed enough length to reach the pelvis.   The rectal stump was then identified and carefully dissected free with cautery.  We then resected the distal  end of the colostomy in order to get clean edges.  We placed dilators through the open end as well and decided that a 25 size EEA stapler anvil would be appropriate.  We created a pursestring using 2-0 Prolene suture.  The anvil was secured with no complications.  Dilators were then used at the rectal end and the 25 size EEA stapler inserted transrectally and spike was deployed.  We were able to connect the anvil and the spike with appropriate click and no complications with good orientation.  We then closed the stapler watching that the ends were approximating appropriately with no gaps.  The stapler was fired and removed.  Then a rigid sigmoidoscope was placed transrectally and after filling the pelvis with saline, the anastomosis was tested using insufflation.  No bubbles were noted intra-abdominally revealing no leaks.  The abdomen was then thoroughly irrigated.  The omentum was placed in its natural location overlying the intestines.  We proceeded with clean closure and changed our gloves and gowns.  80 ml of Exparel solution mixed with 0.5% bupivacaine with epi and 30 ml of saline was infiltrated onto the peritoneum, fascia, and subcutaneous tissue of all the incisions.  The left ostomy site had a hernia sac from the parastomal hernia and this was excised with cautery.  The three incisions were then closed using #1 running PDS sutures, repairing the left parastomal hernia and the incisional umbilical hernia during closure.  Midline wound cavity was irrigated and the skin was closed with staples.  A 1/4 inch penrose drain was left in place at the ostomy sites to allow for drainage and the overlying skin was also closed with staples, stapling the drain onto the skin while closing.  The midline wound was dressed with honeycomb dressing, and the two ostomy sites were dressed with 4x4 gauze and  Tegaderm.   The patient was emerged from anesthesia and extubated and brought to the recovery room for further management.   The patient tolerated the procedure well and all counts were correct at the end of the case.    Howie Ill, MD

## 2019-11-29 NOTE — Anesthesia Preprocedure Evaluation (Signed)
Anesthesia Evaluation  Patient identified by MRN, date of birth, ID band Patient awake    Reviewed: Allergy & Precautions, H&P , NPO status , Patient's Chart, lab work & pertinent test results  History of Anesthesia Complications Negative for: history of anesthetic complications  Airway Mallampati: III  TM Distance: <3 FB Neck ROM: limited    Dental  (+) Chipped, Poor Dentition, Missing   Pulmonary neg shortness of breath, Current Smoker,           Cardiovascular Exercise Tolerance: Good (-) angina(-) Past MI and (-) DOE negative cardio ROS       Neuro/Psych negative neurological ROS  negative psych ROS   GI/Hepatic Neg liver ROS, GERD  Medicated and Controlled,  Endo/Other  diabetes, Type 2  Renal/GU      Musculoskeletal   Abdominal   Peds  Hematology negative hematology ROS (+)   Anesthesia Other Findings Past Medical History: No date: Anemia No date: Diabetes (HCC) No date: Diverticulitis of large intestine with perforation No date: Gastritis No date: GERD (gastroesophageal reflux disease) No date: Hyperlipidemia  Past Surgical History: No date: COLON SURGERY 03/01/2019: COLONOSCOPY WITH PROPOFOL; N/A     Comment:  Procedure: COLONOSCOPY WITH PROPOFOL;  Surgeon:               Pasty Spillers, MD;  Location: [facility];              Service: Endoscopy;  Laterality: N/A;  colon scope out of              rectum @ 0919  and back in stoma @ [phone] No date: COLOSTOMY 06/24/2018: LAPAROTOMY; N/A     Comment:  Procedure: EXPLORATORY LAPAROTOMY;  Surgeon: Henrene Dodge, MD;  Location: [facility];  Service: General;                Laterality: N/A;  BMI    Body Mass Index: 28.34 kg/m      Reproductive/Obstetrics negative OB ROS                             Anesthesia Physical Anesthesia Plan  ASA: III  Anesthesia Plan: General ETT   Post-op Pain  Management:    Induction: Intravenous  PONV Risk Score and Plan: Ondansetron, Dexamethasone, Midazolam and Treatment may vary due to age or medical condition  Airway Management Planned: Oral ETT  Additional Equipment:   Intra-op Plan:   Post-operative Plan: Extubation in OR  Informed Consent: I have reviewed the patients History and Physical, chart, labs and discussed the procedure including the risks, benefits and alternatives for the proposed anesthesia with the patient or authorized representative who has indicated his/her understanding and acceptance.     Dental Advisory Given  Plan Discussed with: Anesthesiologist, CRNA and Surgeon  Anesthesia Plan Comments: (Consent via interpreter   Patient consented for risks of anesthesia including but not limited to:  - adverse reactions to medications - damage to eyes, teeth, lips or other oral mucosa - nerve damage due to positioning  - sore throat or hoarseness - Damage to heart, brain, nerves, lungs or loss of life  Patient voiced understanding.)        Anesthesia Quick Evaluation

## 2019-11-29 NOTE — Anesthesia Procedure Notes (Signed)
Procedure Name: Intubation Date/Time: 11/29/2019 7:45 AM Performed by: Omer Jack, CRNA Pre-anesthesia Checklist: Patient identified, Patient being monitored, Timeout performed, Emergency Drugs available and Suction available Patient Re-evaluated:Patient Re-evaluated prior to induction Oxygen Delivery Method: Circle system utilized Preoxygenation: Pre-oxygenation with 100% oxygen Induction Type: IV induction Ventilation: Mask ventilation without difficulty Laryngoscope Size: 3 and McGraph Grade View: Grade I Tube type: Oral Tube size: 7.0 mm Number of attempts: 1 Airway Equipment and Method: Stylet Placement Confirmation: ETT inserted through vocal cords under direct vision,  positive ETCO2 and breath sounds checked- equal and bilateral Secured at: 23 cm Tube secured with: Tape Dental Injury: Teeth and Oropharynx as per pre-operative assessment

## 2019-11-29 NOTE — Progress Notes (Signed)
Patient has a 211 blood sugar so I called Dr. Suzan Slick, Anesthesiologist and he stated we do not need to treat.

## 2019-11-29 NOTE — Brief Op Note (Signed)
11/29/2019  2:40 PM  PATIENT:  Alexander Carpenter  54 y.o. male  PRE-OPERATIVE DIAGNOSIS:  Ileostomy in place, colostomy in place, parastomal hernia, umbilical hernia  POST-OPERATIVE DIAGNOSIS:  Ileostomy in place, colostomy in place, parastomal hernia, umbilical hernia  PROCEDURE:  Procedure(s): ILEOSTOMY TAKEDOWN (N/A) COLOSTOMY REVERSAL (N/A) HERNIA REPAIR PARASTOMAL (N/A) HERNIA REPAIR UMBILICAL ADULT  Debridement of midline skin and subcutaneous tissue for 20 x 2 cm  SURGEON:  Surgeon(s) and Role:    * Ryken Paschal, MD - Primary    * Hulda Marin, MD - Assisting  ASSISTANTS: Nilda Calamity, PA-S   ANESTHESIA:   general  EBL:  50 mL   BLOOD ADMINISTERED:none  DRAINS: Penrose drain in the bilateral ostomy locations   LOCAL MEDICATIONS USED:  BUPIVICAINE   SPECIMEN:  Source of Specimen:  End ileostomy, end colostomy  DISPOSITION OF SPECIMEN:  PATHOLOGY  COUNTS:  YES  DICTATION: .Dragon Dictation  PLAN OF CARE: Admit to inpatient   PATIENT DISPOSITION:  PACU - hemodynamically stable.   Delay start of Pharmacological VTE agent (>24hrs) due to surgical blood loss or risk of bleeding: yes

## 2019-11-29 NOTE — Anesthesia Postprocedure Evaluation (Signed)
Anesthesia Post Note  Patient: Woodrow Guzman-Galindo  Procedure(s) Performed: ILEOSTOMY TAKEDOWN (N/A ) COLOSTOMY REVERSAL (N/A ) HERNIA REPAIR PARASTOMAL (N/A ) HERNIA REPAIR UMBILICAL ADULT  Patient location during evaluation: PACU Anesthesia Type: General Level of consciousness: awake and alert and oriented Pain management: pain level controlled Vital Signs Assessment: post-procedure vital signs reviewed and stable Respiratory status: spontaneous breathing, nonlabored ventilation and respiratory function stable Cardiovascular status: blood pressure returned to baseline and stable Postop Assessment: no signs of nausea or vomiting Anesthetic complications: no     Last Vitals:  Vitals:   11/29/19 1433 11/29/19 1434  BP: 116/77 116/77  Pulse: 88   Resp: 19 17  Temp: 36.6 C   SpO2: 100% 100%    Last Pain:  Vitals:   11/29/19 1456  TempSrc:   PainSc: 7                  Neldon Shepard

## 2019-11-29 NOTE — Interval H&P Note (Signed)
History and Physical Interval Note:  11/29/2019 7:15 AM  Alexander Carpenter  has presented today for surgery, with the diagnosis of Ileostomy in place, colostomy in place, parastomal hernia.  The various methods of treatment have been discussed with the patient and family. After consideration of risks, benefits and other options for treatment, the patient has consented to  Procedure(s): ILEOSTOMY TAKEDOWN (N/A) COLOSTOMY REVERSAL (N/A) HERNIA REPAIR PARASTOMAL (N/A) as a surgical intervention.  The patient's history has been reviewed, patient examined, no change in status, stable for surgery.  I have reviewed the patient's chart and labs.  Questions were answered to the patient's satisfaction.     [name]

## 2019-11-30 ENCOUNTER — Telehealth: Payer: Self-pay

## 2019-11-30 LAB — BASIC METABOLIC PANEL
Anion gap: 8 (ref 5–15)
BUN: 18 mg/dL (ref 6–20)
CO2: 22 mmol/L (ref 22–32)
Calcium: 7.4 mg/dL — ABNORMAL LOW (ref 8.9–10.3)
Chloride: 110 mmol/L (ref 98–111)
Creatinine, Ser: 1.17 mg/dL (ref 0.61–1.24)
GFR calc Af Amer: >60 mL/min (ref >60)
GFR calc non Af Amer: >60 mL/min (ref >60)
Glucose, Bld: 160 mg/dL — ABNORMAL HIGH (ref 70–99)
Potassium: 3 mmol/L — ABNORMAL LOW (ref 3.5–5.1)
Sodium: 140 mmol/L (ref 135–145)

## 2019-11-30 LAB — GLUCOSE, CAPILLARY
Glucose-Capillary: 156 mg/dL — ABNORMAL HIGH (ref 70–99)
Glucose-Capillary: 126 mg/dL — ABNORMAL HIGH (ref 70–99)
Glucose-Capillary: 163 mg/dL — ABNORMAL HIGH (ref 70–99)
Glucose-Capillary: 130 mg/dL — ABNORMAL HIGH (ref 70–99)
Glucose-Capillary: 163 mg/dL — ABNORMAL HIGH (ref 70–99)

## 2019-11-30 LAB — CBC WITH DIFFERENTIAL/PLATELET
Abs Immature Granulocytes: 0.04 10*3/uL (ref 0.00–0.07)
Basophils Absolute: 0 10*3/uL (ref 0.0–0.1)
Basophils Relative: 0 %
Eosinophils Absolute: 0 10*3/uL (ref 0.0–0.5)
Eosinophils Relative: 0 %
HCT: 37.1 % — ABNORMAL LOW (ref 39.0–52.0)
Hemoglobin: 12.3 g/dL — ABNORMAL LOW (ref 13.0–17.0)
Immature Granulocytes: 0 %
Lymphocytes Relative: 9 %
Lymphs Abs: 1 10*3/uL (ref 0.7–4.0)
MCH: 29.2 pg (ref 26.0–34.0)
MCHC: 33.2 g/dL (ref 30.0–36.0)
MCV: 88.1 fL (ref 80.0–100.0)
Monocytes Absolute: 0.7 10*3/uL (ref 0.1–1.0)
Monocytes Relative: 7 %
Neutro Abs: 8.6 10*3/uL — ABNORMAL HIGH (ref 1.7–7.7)
Neutrophils Relative %: 84 %
Platelets: 200 10*3/uL (ref 150–400)
RBC: 4.21 MIL/uL — ABNORMAL LOW (ref 4.22–5.81)
RDW: 13.4 % (ref 11.5–15.5)
WBC: 10.3 10*3/uL (ref 4.0–10.5)
nRBC: 0 % (ref 0.0–0.2)

## 2019-11-30 LAB — MAGNESIUM: Magnesium: 2 mg/dL (ref 1.7–2.4)

## 2019-11-30 LAB — HIV ANTIBODY (ROUTINE TESTING W REFLEX): HIV Screen 4th Generation wRfx: NONREACTIVE

## 2019-11-30 MED ORDER — ALVIMOPAN 12 MG PO CAPS
12.0000 mg | ORAL_CAPSULE | Freq: Two times a day (BID) | ORAL | Status: DC
  Administered 2019-11-30 – 2019-12-02 (×5): 12 mg via ORAL
  Filled 2019-11-30 (×8): qty 1

## 2019-11-30 MED ORDER — POTASSIUM CHLORIDE CRYS ER 20 MEQ PO TBCR
40.0000 meq | EXTENDED_RELEASE_TABLET | Freq: Four times a day (QID) | ORAL | Status: AC
  Administered 2019-11-30 (×2): 40 meq via ORAL
  Filled 2019-11-30 (×2): qty 2

## 2019-11-30 NOTE — Progress Notes (Signed)
[facility] SURGICAL PROGRESS NOTE  Hospital Day(s): 1.   Post op day(s): 1 Day Post-Op.   Interval History:  Patient seen and examined no acute events or new complaints overnight.  Patient reports he is doing well, abdominal pain primarily at incisions No fever, chills, nausea, or emesis WBC is normal this morning at 10.3K; afebrile Renal function is normal, sCr 1.17, UO - 500 Mild hypokalemia at 3.0 Has been NPO to this point post-op Has not mobilized  Vital signs in last 24 hours: [min-max] current  Temp:  [97.5 F (36.4 C)-98.5 F (36.9 C)] 98.3 F (36.8 C) (04/28 0441) Pulse Rate:  [88-95] 90 (04/28 0441) Resp:  [0-24] 18 (04/28 0441) BP: (105-136)/(52-96) 105/73 (04/28 0441) SpO2:  [92 %-100 %] 98 % (04/28 0441)     Height: 5\' 3"  (160 cm) Weight: 72.6 kg BMI (Calculated): 28.35   Intake/Output last 2 shifts:  04/27 0701 - 04/28 0700 In: 4623.3 [I.V.:4223.3; IV Piggyback:400] Out: 550 [Urine:500; Blood:50]   Physical Exam:  Constitutional: alert, cooperative and no distress  Respiratory: breathing non-labored at rest  Cardiovascular: regular rate and sinus rhythm  Gastrointestinal: Soft, incisional soreness, and non-distended, no rebound/guarding Integumentary: Laparotomy and ostomy incisions are healing well, staples, no erythema or drainage  Labs:  CBC Latest Ref Rng & Units 11/30/2019 11/28/2019 04/07/2019  WBC 4.0 - 10.5 K/uL 10.3 5.7 12.9(H)  Hemoglobin 13.0 - 17.0 g/dL 12.3(L) 14.7 14.5  Hematocrit 39.0 - 52.0 % 37.1(L) 44.0 42.8  Platelets 150 - 400 K/uL 200 251 375   CMP Latest Ref Rng & Units 11/30/2019 11/28/2019 04/07/2019  Glucose 70 - 99 mg/dL 06/07/2019) 423(N) 361(W)  BUN 6 - 20 mg/dL 18 17 431(V)  Creatinine 0.61 - 1.24 mg/dL 40(G 8.67 6.19)  Sodium 135 - 145 mmol/L 140 135 135  Potassium 3.5 - 5.1 mmol/L 3.0(L) 3.9 4.0  Chloride 98 - 111 mmol/L 110 100 100  CO2 22 - 32 mmol/L 22 25 16(L)  Calcium 8.9 - 10.3 mg/dL 7.4(L) 8.7(L) 9.7   Total Protein 6.5 - 8.1 g/dL - - 8.3(H)  Total Bilirubin 0.3 - 1.2 mg/dL - - 1.6(H)  Alkaline Phos 38 - 126 U/L - - 87  AST 15 - 41 U/L - - 55(H)  ALT 0 - 44 U/L - - 108(H)     Imaging studies: No new pertinent imaging studies   Assessment/Plan: 54 y.o. male overall doing well 1 Day Post-Op s/p ileostomy and colostomy takedown.   - Advance to CLD today  - continue IVF resuscitation; wean  - pain control prn  - monitor abdominal examination; on-going bowel function  - Discontinue foley  - medical management of comorbid conditions  - Mobilize  - DVT prophylaxis  All of the above findings and recommendations were discussed with the patient, and the medical team, and all of patient's questions were answered to his expressed satisfaction.  -- 40, PA-C [facility] 11/30/2019, 7:22 AM [phone] M-F: 7am - 4pm

## 2019-11-30 NOTE — Telephone Encounter (Signed)
-----   Message from Pasty Spillers, MD sent at 11/30/2019 10:14 AM EDT ----- Randie Heinz, thank you for keeping me posted. Morrie Sheldon, can you please set him up for clinic follow up in 3 months ----- Message ----- From: Henrene Dodge, MD Sent: 11/30/2019   9:24 AM EDT To: Pasty Spillers, MD  Hi, how are you?  Just wanted to close the loop on this patient.  He underwent surgery yesterday to reverse his ostomies.  Everything went well, and hopefully we'll be able to discharge him home by the weekend.   Have a good day!  Jose  ----- Message ----- From: Pasty Spillers, MD Sent: 10/20/2019  11:14 AM EDT To: Henrene Dodge, MD  Dr. Aleen Campi, Im not sure if this patient was lost to follow up, but we just saw him for regular follow up and he says his previous ileostomy reversal was cancelled and he does not have follow up scheduled with you.

## 2019-11-30 NOTE — Telephone Encounter (Signed)
Patient is currently in the hospital will set up a 3 month recall office visit for patient

## 2019-12-01 LAB — GLUCOSE, CAPILLARY
Glucose-Capillary: 102 mg/dL — ABNORMAL HIGH (ref 70–99)
Glucose-Capillary: 113 mg/dL — ABNORMAL HIGH (ref 70–99)
Glucose-Capillary: 121 mg/dL — ABNORMAL HIGH (ref 70–99)
Glucose-Capillary: 136 mg/dL — ABNORMAL HIGH (ref 70–99)
Glucose-Capillary: 149 mg/dL — ABNORMAL HIGH (ref 70–99)
Glucose-Capillary: 152 mg/dL — ABNORMAL HIGH (ref 70–99)

## 2019-12-01 LAB — SURGICAL PATHOLOGY

## 2019-12-01 MED ORDER — INSULIN ASPART 100 UNIT/ML ~~LOC~~ SOLN
0.0000 [IU] | Freq: Three times a day (TID) | SUBCUTANEOUS | Status: DC
  Administered 2019-12-01 (×2): 2 [IU] via SUBCUTANEOUS
  Administered 2019-12-01: 3 [IU] via SUBCUTANEOUS
  Administered 2019-12-02: 2 [IU] via SUBCUTANEOUS
  Administered 2019-12-02: 5 [IU] via SUBCUTANEOUS
  Administered 2019-12-03: 3 [IU] via SUBCUTANEOUS
  Administered 2019-12-03: 2 [IU] via SUBCUTANEOUS
  Administered 2019-12-04 (×3): 3 [IU] via SUBCUTANEOUS
  Administered 2019-12-05: 2 [IU] via SUBCUTANEOUS
  Filled 2019-12-01 (×10): qty 1

## 2019-12-01 MED ORDER — INSULIN ASPART 100 UNIT/ML ~~LOC~~ SOLN
0.0000 [IU] | Freq: Every day | SUBCUTANEOUS | Status: DC
  Administered 2019-12-02 – 2019-12-03 (×2): 2 [IU] via SUBCUTANEOUS
  Filled 2019-12-01 (×3): qty 1

## 2019-12-01 MED ORDER — BOOST / RESOURCE BREEZE PO LIQD CUSTOM
1.0000 | Freq: Three times a day (TID) | ORAL | Status: DC
  Administered 2019-12-01 – 2019-12-02 (×3): 1 via ORAL

## 2019-12-01 NOTE — Progress Notes (Signed)
Initial Nutrition Assessment  DOCUMENTATION CODES:   Not applicable  INTERVENTION:   Boost Breeze po TID, each supplement provides 250 kcal and 9 grams of protein  NUTRITION DIAGNOSIS:   Increased nutrient needs related to post-op healing as evidenced by increased estimated needs.  GOAL:   Patient will meet greater than or equal to 90% of their needs  MONITOR:   PO intake, Supplement acceptance, Diet advancement, Weight trends, Skin, I & O's, Labs  REASON FOR ASSESSMENT:   Malnutrition Screening Tool    ASSESSMENT:   54 y.o. 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  Pt now presents for ileostomy and colostomy takedown. Pt s/p Ileostomy takedown with ileocolonic anastomosis, colostomy takedown with colorectal anastomosis, splenic flexure mobilization, repair of left parastomal incisional hernia, repair of incisional umbilical hernia, rigid sigmoidoscopy, debridement of skin and subcutaneous tissue for 20 cm x 2 cm on 4/27   RD working remotely.  Pt with increased estimated needs r/t post-op healing. Pt currently eating 100% of his clear liquid diet. Pt is mobilizing. Pt is passing flatus and liquid stools. Pt with some abdominal distension that is improving. Per MD note, pt may be able to advance to full liquids later today. RD will add supplements to help pt meet his estimated needs. Per chart, pt appears fairly weight stable pta.   Medications reviewed and include: lovenox, insulin, protonix, LRS @75ml /hr  Labs reviewed: K 3.0(L), Mg 2.0 wnl  NUTRITION - FOCUSED PHYSICAL EXAM: Unable to perform a this time   Diet Order:   Diet Order            Diet clear liquid Room service appropriate? Yes; Fluid consistency: Thin  Diet effective now             EDUCATION NEEDS:   No education needs have been  identified at this time  Skin:  Skin Assessment: Reviewed RN Assessment(abdominal incision)  Last BM:  4/29- type 6  Height:   Ht Readings from Last 1 Encounters:  11/29/19 5\' 3"  (1.6 m)    Weight:   Wt Readings from Last 1 Encounters:  11/29/19 72.6 kg    Ideal Body Weight:  56.3 kg  BMI:  Body mass index is 28.34 kg/m.  Estimated Nutritional Needs:   Kcal:  1700-2000kcal/day  Protein:  85-95g/day  Fluid:  >1.7L/day  MS, RD, LDN Please refer to [facility] for RD and/or RD on-call/weekend/after hours pager

## 2019-12-01 NOTE — Progress Notes (Signed)
[facility] SURGICAL PROGRESS NOTE  Hospital Day(s): 2.   Post op day(s): 2 Days Post-Op.   Interval History:  Patient seen and examined no acute events or new complaints overnight.  Patient reports he is doing better this morning Abdominal soreness improved No fever, chills, nausea or emesis No new labs On CLD; tolerated well; is passing flatus He does not very watery, small stools Mobilizing   Vital signs in last 24 hours: [min-max] current  Temp:  [97.4 F (36.3 C)-98.8 F (37.1 C)] 98.2 F (36.8 C) (04/29 0440) Pulse Rate:  [77-93] 93 (04/29 0440) Resp:  [18-20] 18 (04/29 0440) BP: (115-132)/(82-97) 128/82 (04/29 0442) SpO2:  [98 %-99 %] 98 % (04/29 0440)     Height: 5\' 3"  (160 cm) Weight: 72.6 kg BMI (Calculated): 28.35   Intake/Output last 2 shifts:  04/28 0701 - 04/29 0700 In: 480 [P.O.:480] Out: 1625 [Urine:1625]   Physical Exam:  Constitutional: alert, cooperative and no distress  Respiratory: breathing non-labored at rest  Cardiovascular: regular rate and sinus rhythm  Gastrointestinal: Soft, incisional soreness, he does appear mildly distended, no rebound/guarding Integumentary: Laparotomy and ostomy incisions are healing well, staples, penrose drains in ostomy sites, no erythema or drainage   Labs:  CBC Latest Ref Rng & Units 11/30/2019 11/28/2019 04/07/2019  WBC 4.0 - 10.5 K/uL 10.3 5.7 12.9(H)  Hemoglobin 13.0 - 17.0 g/dL 12.3(L) 14.7 14.5  Hematocrit 39.0 - 52.0 % 37.1(L) 44.0 42.8  Platelets 150 - 400 K/uL 200 251 375   CMP Latest Ref Rng & Units 11/30/2019 11/28/2019 04/07/2019  Glucose 70 - 99 mg/dL 06/07/2019) 253(G) 644(I)  BUN 6 - 20 mg/dL 18 17 347(Q)  Creatinine 0.61 - 1.24 mg/dL 25(Z 5.63 8.75)  Sodium 135 - 145 mmol/L 140 135 135  Potassium 3.5 - 5.1 mmol/L 3.0(L) 3.9 4.0  Chloride 98 - 111 mmol/L 110 100 100  CO2 22 - 32 mmol/L 22 25 16(L)  Calcium 8.9 - 10.3 mg/dL 7.4(L) 8.7(L) 9.7  Total Protein 6.5 - 8.1 g/dL - - 8.3(H)   Total Bilirubin 0.3 - 1.2 mg/dL - - 1.6(H)  Alkaline Phos 38 - 126 U/L - - 87  AST 15 - 41 U/L - - 55(H)  ALT 0 - 44 U/L - - 108(H)     Imaging studies: No new pertinent imaging studies   Assessment/Plan:  54 y.o. male overall doing well, return of bowel function, but does still appear distended 2 Days Post-Op s/p ileostomy and colostomy takedown.   - CLD; if doing well this evening okay for full liquids   - continue IVF resuscitation; wean             - pain control prn             - monitor abdominal examination; on-going bowel function   - medical management of comorbid conditions; home medications             - Mobilization encouraged             - DVT prophylaxis   All of the above findings and recommendations were discussed with the patient, and the medical team, and all of patient's questions were answered to his expressed satisfaction.  -- 40, PA-C  [facility] 12/01/2019, 7:31 AM [phone] M-F: 7am - 4pm

## 2019-12-02 ENCOUNTER — Inpatient Hospital Stay: Payer: Self-pay

## 2019-12-02 LAB — CBC
HCT: 37.7 % — ABNORMAL LOW (ref 39.0–52.0)
Hemoglobin: 12.4 g/dL — ABNORMAL LOW (ref 13.0–17.0)
MCH: 29.2 pg (ref 26.0–34.0)
MCHC: 32.9 g/dL (ref 30.0–36.0)
MCV: 88.9 fL (ref 80.0–100.0)
Platelets: 221 10*3/uL (ref 150–400)
RBC: 4.24 MIL/uL (ref 4.22–5.81)
RDW: 13.1 % (ref 11.5–15.5)
WBC: 2.6 10*3/uL — ABNORMAL LOW (ref 4.0–10.5)
nRBC: 0 % (ref 0.0–0.2)

## 2019-12-02 LAB — LACTIC ACID, PLASMA
Lactic Acid, Venous: 2.7 mmol/L (ref 0.5–1.9)
Lactic Acid, Venous: 3.5 mmol/L (ref 0.5–1.9)

## 2019-12-02 LAB — BASIC METABOLIC PANEL
Anion gap: 10 (ref 5–15)
BUN: 13 mg/dL (ref 6–20)
CO2: 23 mmol/L (ref 22–32)
Calcium: 8.5 mg/dL — ABNORMAL LOW (ref 8.9–10.3)
Chloride: 104 mmol/L (ref 98–111)
Creatinine, Ser: 0.74 mg/dL (ref 0.61–1.24)
GFR calc Af Amer: >60 mL/min (ref >60)
GFR calc non Af Amer: >60 mL/min (ref >60)
Glucose, Bld: 159 mg/dL — ABNORMAL HIGH (ref 70–99)
Potassium: 3.3 mmol/L — ABNORMAL LOW (ref 3.5–5.1)
Sodium: 137 mmol/L (ref 135–145)

## 2019-12-02 LAB — GLUCOSE, CAPILLARY
Glucose-Capillary: 123 mg/dL — ABNORMAL HIGH (ref 70–99)
Glucose-Capillary: 229 mg/dL — ABNORMAL HIGH (ref 70–99)
Glucose-Capillary: 178 mg/dL — ABNORMAL HIGH (ref 70–99)
Glucose-Capillary: 178 mg/dL — ABNORMAL HIGH (ref 70–99)
Glucose-Capillary: 211 mg/dL — ABNORMAL HIGH (ref 70–99)

## 2019-12-02 MED ORDER — LACTATED RINGERS IV BOLUS
1000.0000 mL | Freq: Once | INTRAVENOUS | Status: AC
  Administered 2019-12-02: 1000 mL via INTRAVENOUS

## 2019-12-02 MED ORDER — PANTOPRAZOLE SODIUM 40 MG PO TBEC
40.0000 mg | DELAYED_RELEASE_TABLET | Freq: Every day | ORAL | Status: DC
  Administered 2019-12-02 – 2019-12-09 (×8): 40 mg via ORAL
  Filled 2019-12-02 (×8): qty 1

## 2019-12-02 MED ORDER — CHLORHEXIDINE GLUCONATE CLOTH 2 % EX PADS
6.0000 | MEDICATED_PAD | Freq: Every day | CUTANEOUS | Status: DC
  Administered 2019-12-02 – 2019-12-08 (×5): 6 via TOPICAL

## 2019-12-02 MED ORDER — PIPERACILLIN-TAZOBACTAM 3.375 G IVPB
3.3750 g | Freq: Three times a day (TID) | INTRAVENOUS | Status: DC
  Administered 2019-12-02 – 2019-12-09 (×20): 3.375 g via INTRAVENOUS
  Filled 2019-12-02 (×22): qty 50

## 2019-12-02 MED ORDER — POTASSIUM CHLORIDE CRYS ER 20 MEQ PO TBCR
40.0000 meq | EXTENDED_RELEASE_TABLET | ORAL | Status: AC
  Administered 2019-12-02: 40 meq via ORAL
  Filled 2019-12-02 (×2): qty 2

## 2019-12-02 MED ORDER — VANCOMYCIN HCL 1500 MG/300ML IV SOLN
1500.0000 mg | Freq: Once | INTRAVENOUS | Status: AC
  Administered 2019-12-02: 1500 mg via INTRAVENOUS
  Filled 2019-12-02 (×2): qty 300

## 2019-12-02 MED ORDER — VANCOMYCIN HCL IN DEXTROSE 1-5 GM/200ML-% IV SOLN
1000.0000 mg | Freq: Two times a day (BID) | INTRAVENOUS | Status: DC
  Filled 2019-12-02 (×2): qty 200

## 2019-12-02 MED ORDER — ENSURE ENLIVE PO LIQD: 237.0000 mL | Freq: Two times a day (BID) | ORAL | Status: DC

## 2019-12-02 MED ORDER — VANCOMYCIN HCL IN DEXTROSE 1-5 GM/200ML-% IV SOLN
1000.0000 mg | Freq: Once | INTRAVENOUS | Status: DC
  Filled 2019-12-02: qty 200

## 2019-12-02 MED ORDER — PIPERACILLIN-TAZOBACTAM 3.375 G IVPB 30 MIN: 3.3750 g | Freq: Three times a day (TID) | INTRAVENOUS | Status: DC

## 2019-12-02 MED ORDER — KCL IN DEXTROSE-NACL 20-5-0.45 MEQ/L-%-% IV SOLN
INTRAVENOUS | Status: DC
  Filled 2019-12-02 (×6): qty 1000

## 2019-12-02 NOTE — Progress Notes (Signed)
Chaplain responded to rapid response. Patient had post op issue. Chaplain offered prayer of comfort. Ministry of presence.

## 2019-12-02 NOTE — Progress Notes (Signed)
Dr Lady Gary returned page orders given

## 2019-12-02 NOTE — Progress Notes (Signed)
Patient tachypnic, tachycardic, afebrile. Called MD, MD assessed, Xray done. Upon rechecking vitals patient temperature 103.1. rapid response called. MD notified. Tylenol and Toradol given to patient. Will continue to monitor. If no improvement will call ICU as directed.

## 2019-12-02 NOTE — TOC Initial Note (Signed)
Transition of Care [facility]) - Initial/Assessment Note    Patient Details  Name: Alexander Carpenter MRN: [id number] Date of Birth: 1966/03/22  Transition of Care [facility]) CM/SW Contact:    Beverly Sessions, RN Phone Number: 12/02/2019, 3:34 PM  Clinical Narrative:                 Patient admitted from home.  S/p ostomy reversal.  Assessment completed with spanish interpreter  Patient states that he lives with a roommate and they are renting a mobile home Patient states that he is currently unemployed, and does not have insurance   Patient states that he relies on friends and a neighbor to provide transportation  PCP - [facility] Daryel Gerald at [facility]  Patient states that he obtains his medications from [facility]  Patient states that he is able to afford all of his medications and the most expensive one is $10  Patient states that his glucometer is no longer working, [facility] provided patient with glucometer kit  TOC following for any medications needs at discharge.   Expected Discharge Plan: Home/Self Care Barriers to Discharge: Continued Medical Work up   Patient Goals and CMS Choice        Expected Discharge Plan and Services Expected Discharge Plan: Home/Self Care   Discharge Planning Services: CM Consult   Living arrangements for the past 2 months: Mobile Home                                      Prior Living Arrangements/Services Living arrangements for the past 2 months: Mobile Home Lives with:: Roommate Patient language and need for interpreter reviewed:: Yes Do you feel safe going back to the place where you live?: Yes      Need for Family Participation in Patient Care: No (Comment) Care giver support system in place?: No (comment)   Criminal Activity/Legal Involvement Pertinent to Current Situation/Hospitalization: No - Comment as needed  Activities of Daily Living Home Assistive Devices/Equipment: None ADL Screening  (condition at time of admission) Patient's cognitive ability adequate to safely complete daily activities?: Yes Is the patient deaf or have difficulty hearing?: No Does the patient have difficulty seeing, even when wearing glasses/contacts?: No Does the patient have difficulty concentrating, remembering, or making decisions?: No Patient able to express need for assistance with ADLs?: Yes Does the patient have difficulty dressing or bathing?: No Independently performs ADLs?: Yes (appropriate for developmental age) Does the patient have difficulty walking or climbing stairs?: No Weakness of Legs: None Weakness of Arms/Hands: None  Permission Sought/Granted                  Emotional Assessment Appearance:: Appears stated age Attitude/Demeanor/Rapport: Gracious Affect (typically observed): Accepting Orientation: : Oriented to Self, Oriented to Place, Oriented to  Time, Oriented to Situation   Psych Involvement: No (comment)  Admission diagnosis:  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 Colostomy in place [facility])   . Preop examination   . Diverticulosis of large intestine without diverticulitis   . Colonic edema   . Diverticulitis of sigmoid colon 07/07/2018  . Large bowel obstruction ([place]) 07/07/2018  . Cecum perforation 07/07/2018  . Pneumoperitoneum    PCP:  Ranae Plumber, [place] Pharmacy:   [phone]  [place] [address] Phone: [phone] Fax: [phone]     Social Determinants of Health (SDOH) Interventions    Readmission Risk Interventions No flowsheet data found.

## 2019-12-02 NOTE — Progress Notes (Signed)
Dr Lady Gary paged regarding Lactic 2.7, temp 102.9, hr 129 paged x3, no response

## 2019-12-02 NOTE — Significant Event (Signed)
Rapid Response Event Note  Overview: Time Called: 1813 Arrival Time: 1815 Event Type: MEWS  Initial Focused Assessment:  Patient is AA+Ox4. On 2L/min [place]. HR in 140s. Febrile at 103. C/O ABD pain. Abdomen soft but tender.  Interventions:  - MD notified.  - Scheduled 1800 meds given, including APAP and Toradol. - Orders for EKG, Lactic acid, CBC, CXR.  Plan of Care (if not transferred):  Treating known possible causes for tachycardia (pain and fever). Labs and imaging pending. BP and neuro status stable. No immediate need for transfer to higher level of care.  Event Summary: Name of Physician Notified: Dr. Lady Gary at 1818    at    Outcome: Stayed in room and stabalized  Event End Time: 1850  Rosana Fret

## 2019-12-02 NOTE — Progress Notes (Signed)
[facility] SURGICAL PROGRESS NOTE  Hospital Day(s): 3.   Post op day(s): 3 Days Post-Op.   Interval History:  Patient seen and examined no acute events or new complaints overnight.  Patient reports he is still distended some, does endorse belching No abdominal pain, nausea, or emesis No new labs Advanced to full liquids overnight, he does endorse flatus, and small loose stools Mobilizing well   Vital signs in last 24 hours: [min-max] current  Temp:  [98 F (36.7 C)-98.4 F (36.9 C)] 98.4 F (36.9 C) (04/30 0533) Pulse Rate:  [83-91] 83 (04/30 0533) Resp:  [18] 18 (04/30 0533) BP: (116-119)/(83-86) 119/85 (04/30 0533) SpO2:  [98 %-99 %] 99 % (04/30 0533)     Height: 5\' 3"  (160 cm) Weight: 72.6 kg BMI (Calculated): 28.35   Intake/Output last 2 shifts:  04/29 0701 - 04/30 0700 In: 240 [P.O.:240] Out: -    Physical Exam:  Constitutional: alert, cooperative and no distress  Respiratory: breathing non-labored at rest  Cardiovascular: regular rate and sinus rhythm  Gastrointestinal:Soft,incisional soreness, he does appear mildly distended, no rebound/guarding Integumentary:Laparotomy and ostomy incisions are healing well, staples, penrose drains in ostomy sites, no erythema or drainage   Labs:  CBC Latest Ref Rng & Units 11/30/2019 11/28/2019 04/07/2019  WBC 4.0 - 10.5 K/uL 10.3 5.7 12.9(H)  Hemoglobin 13.0 - 17.0 g/dL 12.3(L) 14.7 14.5  Hematocrit 39.0 - 52.0 % 37.1(L) 44.0 42.8  Platelets 150 - 400 K/uL 200 251 375   CMP Latest Ref Rng & Units 11/30/2019 11/28/2019 04/07/2019  Glucose 70 - 99 mg/dL 06/07/2019) 950(D) 326(Z)  BUN 6 - 20 mg/dL 18 17 124(P)  Creatinine 0.61 - 1.24 mg/dL 80(D 9.83 3.82)  Sodium 135 - 145 mmol/L 140 135 135  Potassium 3.5 - 5.1 mmol/L 3.0(L) 3.9 4.0  Chloride 98 - 111 mmol/L 110 100 100  CO2 22 - 32 mmol/L 22 25 16(L)  Calcium 8.9 - 10.3 mg/dL 7.4(L) 8.7(L) 9.7  Total Protein 6.5 - 8.1 g/dL - - 8.3(H)  Total Bilirubin 0.3 -  1.2 mg/dL - - 1.6(H)  Alkaline Phos 38 - 126 U/L - - 87  AST 15 - 41 U/L - - 55(H)  ALT 0 - 44 U/L - - 108(H)    Imaging studies: No new pertinent imaging studies   Assessment/Plan:  54 y.o. male doing well, with questionable return of definitive bowel function and remains distended 3 Days Post-Op s/p ileostomy and colostomy takedown..   - Continue full liquids; advance to soft tomorrow  - Wean from IVF   - pain control prn - monitor abdominal examination; on-going bowel function              - medical management of comorbid conditions; home medications - Mobilization encouraged - DVT prophylaxis    - Discharge Planning: If doing well in the morning, advance to soft and home in next 24-48 hours if clinically doing well  All of the above findings and recommendations were discussed with the patient, and the medical team, and all of patient's questions were answered to his expressed satisfaction.  -- 40, PA-C [facility] 12/02/2019, 7:36 AM [phone] M-F: 7am - 4pm

## 2019-12-02 NOTE — Progress Notes (Signed)
Pharmacy Antibiotic Note  Alexander Carpenter is a 54 y.o. male admitted on 11/29/2019. Pharmacy has been consulted for vancomycin dosing.  Plan: Vancomycin 1500 mg IV x 1 followed by vancomycin 1000 mg IV q12hr  Goal AUC 400-550 Expected AUC: 505 SCr used: 0.8   Height: 5\' 3"  (160 cm) Weight: 72.6 kg (160 lb) IBW/kg (Calculated) : 56.9  Temp (24hrs), Avg:100.3 F (37.9 C), Min:98.4 F (36.9 C), Max:103.1 F (39.5 C)  Recent Labs  Lab 11/28/19 0918 11/30/19 0528 12/02/19 0950 12/02/19 1853  WBC 5.7 10.3  --  2.6*  CREATININE 0.92 1.17 0.74  --   LATICACIDVEN  --   --   --  2.7*    Estimated Creatinine Clearance: 95.5 mL/min (by C-G formula based on SCr of 0.74 mg/dL).    No Known Allergies  Antimicrobials this admission: Vancomycin 4/30 >> Zosyn 4/30 >>  Dose adjustments this admission: NA  Microbiology results: 4/30 BCx: pending   Thank you for allowing pharmacy to be a part of this patient's care.  5/30, PharmD 12/02/2019 7:53 PM

## 2019-12-03 ENCOUNTER — Inpatient Hospital Stay: Payer: Self-pay | Admitting: Anesthesiology

## 2019-12-03 ENCOUNTER — Encounter
Admission: RE | Disposition: A | Discharge status: Home / Self Care | Payer: Self-pay | Source: Home / Self Care | Attending: Surgery

## 2019-12-03 ENCOUNTER — Inpatient Hospital Stay: Payer: Self-pay

## 2019-12-03 ENCOUNTER — Encounter: Payer: Self-pay | Admitting: Surgery

## 2019-12-03 DIAGNOSIS — G9341 Metabolic encephalopathy: Secondary | ICD-10-CM

## 2019-12-03 DIAGNOSIS — K9189 Other postprocedural complications and disorders of digestive system: Secondary | ICD-10-CM

## 2019-12-03 HISTORY — PX: LAPAROTOMY: SHX154

## 2019-12-03 HISTORY — PX: BOWEL RESECTION: SHX1257

## 2019-12-03 LAB — PHOSPHORUS: Phosphorus: 4.6 mg/dL (ref 2.5–4.6)

## 2019-12-03 LAB — GLUCOSE, CAPILLARY
Glucose-Capillary: 133 mg/dL — ABNORMAL HIGH (ref 70–99)
Glucose-Capillary: 150 mg/dL — ABNORMAL HIGH (ref 70–99)
Glucose-Capillary: 162 mg/dL — ABNORMAL HIGH (ref 70–99)
Glucose-Capillary: 181 mg/dL — ABNORMAL HIGH (ref 70–99)
Glucose-Capillary: 203 mg/dL — ABNORMAL HIGH (ref 70–99)
Glucose-Capillary: 220 mg/dL — ABNORMAL HIGH (ref 70–99)

## 2019-12-03 LAB — CBC
HCT: 35.4 % — ABNORMAL LOW (ref 39.0–52.0)
HCT: 31.4 % — ABNORMAL LOW (ref 39.0–52.0)
Hemoglobin: 12 g/dL — ABNORMAL LOW (ref 13.0–17.0)
Hemoglobin: 10.3 g/dL — ABNORMAL LOW (ref 13.0–17.0)
MCH: 29.8 pg (ref 26.0–34.0)
MCH: 29.5 pg (ref 26.0–34.0)
MCHC: 33.9 g/dL (ref 30.0–36.0)
MCHC: 32.8 g/dL (ref 30.0–36.0)
MCV: 87.8 fL (ref 80.0–100.0)
MCV: 90 fL (ref 80.0–100.0)
Platelets: 240 10*3/uL (ref 150–400)
Platelets: 217 10*3/uL (ref 150–400)
RBC: 4.03 MIL/uL — ABNORMAL LOW (ref 4.22–5.81)
RBC: 3.49 MIL/uL — ABNORMAL LOW (ref 4.22–5.81)
RDW: 13.3 % (ref 11.5–15.5)
RDW: 13.5 % (ref 11.5–15.5)
WBC: 10.8 10*3/uL — ABNORMAL HIGH (ref 4.0–10.5)
WBC: 11.1 10*3/uL — ABNORMAL HIGH (ref 4.0–10.5)
nRBC: 0 % (ref 0.0–0.2)
nRBC: 0 % (ref 0.0–0.2)

## 2019-12-03 LAB — BASIC METABOLIC PANEL
Anion gap: 9 (ref 5–15)
BUN: 20 mg/dL (ref 6–20)
CO2: 21 mmol/L — ABNORMAL LOW (ref 22–32)
Calcium: 8.2 mg/dL — ABNORMAL LOW (ref 8.9–10.3)
Chloride: 105 mmol/L (ref 98–111)
Creatinine, Ser: 1.43 mg/dL — ABNORMAL HIGH (ref 0.61–1.24)
GFR calc Af Amer: >60 mL/min (ref >60)
GFR calc non Af Amer: 56 mL/min — ABNORMAL LOW (ref >60)
Glucose, Bld: 212 mg/dL — ABNORMAL HIGH (ref 70–99)
Potassium: 3.7 mmol/L (ref 3.5–5.1)
Sodium: 135 mmol/L (ref 135–145)

## 2019-12-03 LAB — PROTIME-INR
INR: 1.2 (ref 0.8–1.2)
Prothrombin Time: 14.4 seconds (ref 11.4–15.2)

## 2019-12-03 LAB — BRAIN NATRIURETIC PEPTIDE: B Natriuretic Peptide: 188 pg/mL — ABNORMAL HIGH (ref 0.0–100.0)

## 2019-12-03 LAB — LACTIC ACID, PLASMA
Lactic Acid, Venous: 1.9 mmol/L (ref 0.5–1.9)
Lactic Acid, Venous: 1.6 mmol/L (ref 0.5–1.9)

## 2019-12-03 LAB — TROPONIN I (HIGH SENSITIVITY): Troponin I (High Sensitivity): 5 ng/L (ref <18)

## 2019-12-03 LAB — LIPID PANEL
Cholesterol: 86 mg/dL (ref 0–200)
HDL: 21 mg/dL — ABNORMAL LOW (ref >40)
LDL Cholesterol: 37 mg/dL (ref 0–99)
Total CHOL/HDL Ratio: 4.1 RATIO
Triglycerides: 139 mg/dL (ref <150)
VLDL: 28 mg/dL (ref 0–40)

## 2019-12-03 LAB — MAGNESIUM: Magnesium: 1.7 mg/dL (ref 1.7–2.4)

## 2019-12-03 SURGERY — LAPAROTOMY, EXPLORATORY
Anesthesia: General

## 2019-12-03 MED ORDER — SUCCINYLCHOLINE CHLORIDE 20 MG/ML IJ SOLN
INTRAMUSCULAR | Status: DC | PRN
  Administered 2019-12-03: 120 mg via INTRAVENOUS

## 2019-12-03 MED ORDER — MIDAZOLAM HCL 2 MG/2ML IJ SOLN
INTRAMUSCULAR | Status: AC
  Filled 2019-12-03: qty 2

## 2019-12-03 MED ORDER — MIDAZOLAM HCL 2 MG/2ML IJ SOLN
INTRAMUSCULAR | Status: DC | PRN
  Administered 2019-12-03: 2 mg via INTRAVENOUS

## 2019-12-03 MED ORDER — ALBUMIN HUMAN 5 % IV SOLN: INTRAVENOUS | Status: DC | PRN

## 2019-12-03 MED ORDER — PROPOFOL 10 MG/ML IV BOLUS
INTRAVENOUS | Status: DC | PRN
  Administered 2019-12-03: 120 mg via INTRAVENOUS

## 2019-12-03 MED ORDER — POTASSIUM CHLORIDE IN NACL 20-0.9 MEQ/L-% IV SOLN
INTRAVENOUS | Status: DC
  Filled 2019-12-03 (×7): qty 1000

## 2019-12-03 MED ORDER — IOHEXOL 9 MG/ML PO SOLN
500.0000 mL | ORAL | Status: AC
  Administered 2019-12-03 (×2): 500 mL via ORAL

## 2019-12-03 MED ORDER — ONDANSETRON HCL 4 MG/2ML IJ SOLN: 4.0000 mg | Freq: Once | INTRAMUSCULAR | Status: DC | PRN

## 2019-12-03 MED ORDER — LIDOCAINE HCL (CARDIAC) PF 100 MG/5ML IV SOSY
PREFILLED_SYRINGE | INTRAVENOUS | Status: DC | PRN
  Administered 2019-12-03: 100 mg via INTRAVENOUS

## 2019-12-03 MED ORDER — PHENYLEPHRINE HCL (PRESSORS) 10 MG/ML IV SOLN
INTRAVENOUS | Status: DC | PRN
  Administered 2019-12-03 (×2): 200 ug via INTRAVENOUS

## 2019-12-03 MED ORDER — FENTANYL CITRATE (PF) 100 MCG/2ML IJ SOLN
INTRAMUSCULAR | Status: DC | PRN
  Administered 2019-12-03 (×3): 50 ug via INTRAVENOUS
  Administered 2019-12-03: 100 ug via INTRAVENOUS

## 2019-12-03 MED ORDER — IOHEXOL 300 MG/ML  SOLN
100.0000 mL | Freq: Once | INTRAMUSCULAR | Status: AC | PRN
  Administered 2019-12-03: 100 mL via INTRAVENOUS

## 2019-12-03 MED ORDER — HYDROMORPHONE HCL 1 MG/ML IJ SOLN
0.5000 mg | INTRAMUSCULAR | Status: DC | PRN
  Administered 2019-12-03: 0.5 mg via INTRAVENOUS
  Filled 2019-12-03: qty 1

## 2019-12-03 MED ORDER — FENTANYL CITRATE (PF) 250 MCG/5ML IJ SOLN
INTRAMUSCULAR | Status: AC
  Filled 2019-12-03: qty 5

## 2019-12-03 MED ORDER — LACTATED RINGERS IV BOLUS
1000.0000 mL | Freq: Once | INTRAVENOUS | Status: AC
  Administered 2019-12-03: 1000 mL via INTRAVENOUS

## 2019-12-03 MED ORDER — ONDANSETRON HCL 4 MG/2ML IJ SOLN
INTRAMUSCULAR | Status: AC
  Filled 2019-12-03: qty 2

## 2019-12-03 MED ORDER — SUGAMMADEX SODIUM 200 MG/2ML IV SOLN
INTRAVENOUS | Status: DC | PRN
  Administered 2019-12-03: 150 mg via INTRAVENOUS

## 2019-12-03 MED ORDER — FENTANYL CITRATE (PF) 100 MCG/2ML IJ SOLN
INTRAMUSCULAR | Status: AC
  Administered 2019-12-03: 25 ug via INTRAVENOUS
  Filled 2019-12-03: qty 2

## 2019-12-03 MED ORDER — DEXAMETHASONE SODIUM PHOSPHATE 10 MG/ML IJ SOLN
INTRAMUSCULAR | Status: DC | PRN
  Administered 2019-12-03: 10 mg via INTRAVENOUS

## 2019-12-03 MED ORDER — ROCURONIUM BROMIDE 100 MG/10ML IV SOLN
INTRAVENOUS | Status: DC | PRN
  Administered 2019-12-03 (×2): 20 mg via INTRAVENOUS
  Administered 2019-12-03: 5 mg via INTRAVENOUS
  Administered 2019-12-03: 45 mg via INTRAVENOUS

## 2019-12-03 MED ORDER — VANCOMYCIN HCL IN DEXTROSE 1-5 GM/200ML-% IV SOLN
1000.0000 mg | INTRAVENOUS | Status: DC
  Administered 2019-12-03: 1000 mg via INTRAVENOUS
  Filled 2019-12-03 (×2): qty 200

## 2019-12-03 MED ORDER — KETAMINE HCL 50 MG/ML IJ SOLN
INTRAMUSCULAR | Status: DC | PRN
  Administered 2019-12-03: 40 mg via INTRAMUSCULAR

## 2019-12-03 MED ORDER — KETAMINE HCL 50 MG/ML IJ SOLN
INTRAMUSCULAR | Status: AC
  Filled 2019-12-03: qty 10

## 2019-12-03 MED ORDER — ALBUMIN HUMAN 5 % IV SOLN
INTRAVENOUS | Status: AC
  Filled 2019-12-03: qty 250

## 2019-12-03 MED ORDER — LACTATED RINGERS IV SOLN: INTRAVENOUS | Status: DC | PRN

## 2019-12-03 MED ORDER — SODIUM CHLORIDE 0.9 % IV SOLN
INTRAVENOUS | Status: DC | PRN
  Administered 2019-12-03: 40 ug/min via INTRAVENOUS

## 2019-12-03 MED ORDER — FENTANYL CITRATE (PF) 100 MCG/2ML IJ SOLN
25.0000 ug | INTRAMUSCULAR | Status: DC | PRN
  Administered 2019-12-03 (×3): 25 ug via INTRAVENOUS

## 2019-12-03 MED ORDER — PROPOFOL 10 MG/ML IV BOLUS
INTRAVENOUS | Status: AC
  Filled 2019-12-03: qty 20

## 2019-12-03 MED ORDER — VASOPRESSIN 20 UNIT/ML IV SOLN
INTRAVENOUS | Status: DC | PRN
Start: 2019-12-03 — End: 2019-12-03
  Administered 2019-12-03 (×4): 2 [IU] via INTRAVENOUS

## 2019-12-03 SURGICAL SUPPLY — 60 items
CANISTER SUCT 1200ML W/VALVE (MISCELLANEOUS) ×2 IMPLANT
CHLORAPREP W/TINT 26 (MISCELLANEOUS) IMPLANT
COVER WAND RF STERILE (DRAPES) ×2 IMPLANT
DRAPE LAPAROTOMY 100X77 ABD (DRAPES) ×2 IMPLANT
DRAPE UTILITY 15X26 TOWEL STRL (DRAPES) IMPLANT
DRSG TELFA 4X14 ISLAND NADH (GAUZE/BANDAGES/DRESSINGS) IMPLANT
DRSG TELFA 4X8 ISLAND PHMB (GAUZE/BANDAGES/DRESSINGS) IMPLANT
ELECT BLADE 6.5 EXT (BLADE) ×2 IMPLANT
ELECT CAUTERY BLADE 6.4 (BLADE) ×2 IMPLANT
ELECT CAUTERY BLADE TIP 2.5 (TIP)
ELECT EZSTD 165MM 6.5IN (MISCELLANEOUS) ×2
ELECT REM PT RETURN 9FT ADLT (ELECTROSURGICAL) ×2
ELECTRODE CAUTERY BLDE TIP 2.5 (TIP) IMPLANT
ELECTRODE EZSTD 165MM 6.5IN (MISCELLANEOUS) ×1 IMPLANT
ELECTRODE REM PT RTRN 9FT ADLT (ELECTROSURGICAL) ×1 IMPLANT
GAUZE 4X4 16PLY RFD (DISPOSABLE) ×2 IMPLANT
GAUZE SPONGE 4X4 12PLY STRL (GAUZE/BANDAGES/DRESSINGS) IMPLANT
GLOVE BIO SURGEON STRL SZ 6.5 (GLOVE) ×4 IMPLANT
GLOVE BIOGEL PI IND STRL 7.0 (GLOVE) ×2 IMPLANT
GLOVE BIOGEL PI INDICATOR 7.0 (GLOVE) ×2
GLOVE INDICATOR 7.0 STRL GRN (GLOVE) ×4 IMPLANT
GOWN STRL REUS W/ TWL LRG LVL3 (GOWN DISPOSABLE) ×2 IMPLANT
GOWN STRL REUS W/TWL LRG LVL3 (GOWN DISPOSABLE) ×2
KIT TURNOVER KIT A (KITS) ×2 IMPLANT
LABEL OR SOLS (LABEL) ×2 IMPLANT
LIGASURE IMPACT 36 18CM CVD LR (INSTRUMENTS) ×2 IMPLANT
NEEDLE HYPO 22GX1.5 SAFETY (NEEDLE) ×2 IMPLANT
NS IRRIG 1000ML POUR BTL (IV SOLUTION) ×2 IMPLANT
PACK BASIN MAJOR (MISCELLANEOUS) ×2 IMPLANT
PACK COLON CLEAN CLOSURE (MISCELLANEOUS) ×2 IMPLANT
RELOAD PROXIMATE 75MM BLUE (ENDOMECHANICALS) ×8 IMPLANT
REMOVER STAPLE SKIN (DISPOSABLE) ×2 IMPLANT
SOL PREP PVP 2OZ (MISCELLANEOUS) ×2
SOLUTION PREP PVP 2OZ (MISCELLANEOUS) ×1 IMPLANT
SPONGE LAP 18X18 RF (DISPOSABLE) IMPLANT
STAPLER CUT CVD 40MM BLUE (STAPLE) IMPLANT
STAPLER PROXIMATE 75MM BLUE (STAPLE) ×2 IMPLANT
STAPLER SKIN PROX 35W (STAPLE) IMPLANT
STRIP CLOSURE SKIN 1/2X4 (GAUZE/BANDAGES/DRESSINGS) IMPLANT
SUT PDS AB 1 TP1 54 (SUTURE) ×4 IMPLANT
SUT PDS AB 1 TP1 96 (SUTURE) IMPLANT
SUT PROLENE 0 CT 1 30 (SUTURE) IMPLANT
SUT SILK 2 0 (SUTURE)
SUT SILK 2 0SH CR/8 30 (SUTURE) IMPLANT
SUT SILK 2-0 18XBRD TIE 12 (SUTURE) IMPLANT
SUT SILK 2-0 30XBRD TIE 12 (SUTURE) IMPLANT
SUT SILK 3 0 (SUTURE)
SUT SILK 3-0 (SUTURE) ×8 IMPLANT
SUT SILK 3-0 18XBRD TIE 12 (SUTURE) IMPLANT
SUT VIC AB 2-0 BRD 54 (SUTURE) IMPLANT
SUT VIC AB 2-0 CT1 27 (SUTURE)
SUT VIC AB 2-0 CT1 TAPERPNT 27 (SUTURE) IMPLANT
SUT VIC AB 3-0 54X BRD REEL (SUTURE) IMPLANT
SUT VIC AB 3-0 BRD 54 (SUTURE)
SUT VIC AB 3-0 SH 27 (SUTURE) ×1
SUT VIC AB 3-0 SH 27X BRD (SUTURE) ×1 IMPLANT
SYR 20ML LL LF (SYRINGE) IMPLANT
SYR 30ML LL (SYRINGE) IMPLANT
SYR BULB IRRIG 60ML STRL (SYRINGE) ×2 IMPLANT
TRAY FOLEY MTR SLVR 16FR STAT (SET/KITS/TRAYS/PACK) ×2 IMPLANT

## 2019-12-03 NOTE — Anesthesia Procedure Notes (Signed)
Performed by: Rashon Westrup, MD       

## 2019-12-03 NOTE — Progress Notes (Signed)
Pt returned to unit from CT at this time. VSS. °

## 2019-12-03 NOTE — Progress Notes (Signed)
Pt off unit back to OR at this time with Dr. Lady Gary. VSS.

## 2019-12-03 NOTE — Anesthesia Preprocedure Evaluation (Signed)
Anesthesia Evaluation  Patient identified by MRN, date of birth, ID band Patient awake    Reviewed: Allergy & Precautions, H&P , NPO status , Patient's Chart, lab work & pertinent test results  History of Anesthesia Complications Negative for: history of anesthetic complications  Airway Mallampati: III  TM Distance: <3 FB Neck ROM: limited    Dental  (+) Chipped, Poor Dentition, Missing   Pulmonary neg shortness of breath, Current Smoker,           Cardiovascular Exercise Tolerance: Good (-) angina(-) Past MI and (-) DOE negative cardio ROS       Neuro/Psych negative neurological ROS  negative psych ROS   GI/Hepatic Neg liver ROS, GERD  Medicated and Controlled,  Endo/Other  diabetes, Type 2  Renal/GU      Musculoskeletal   Abdominal   Peds  Hematology negative hematology ROS (+)   Anesthesia Other Findings Past Medical History: No date: Anemia No date: Diabetes (HCC) No date: Diverticulitis of large intestine with perforation No date: Gastritis No date: GERD (gastroesophageal reflux disease) No date: Hyperlipidemia  Past Surgical History: No date: COLON SURGERY 03/01/2019: COLONOSCOPY WITH PROPOFOL; N/A     Comment:  Procedure: COLONOSCOPY WITH PROPOFOL;  Surgeon:               Pasty Spillers, MD;  Location: [facility];              Service: Endoscopy;  Laterality: N/A;  colon scope out of              rectum @ 0919  and back in stoma @ [id number] No date: COLOSTOMY 06/24/2018: LAPAROTOMY; N/A     Comment:  Procedure: EXPLORATORY LAPAROTOMY;  Surgeon: Henrene Dodge, MD;  Location: [facility];  Service: General;                Laterality: N/A;  BMI    Body Mass Index: 28.34 kg/m      Reproductive/Obstetrics negative OB ROS                             Anesthesia Physical  Anesthesia Plan  ASA: III  Anesthesia Plan: General ETT   Post-op Pain  Management:    Induction: Intravenous  PONV Risk Score and Plan: Ondansetron, Dexamethasone, Midazolam and Treatment may vary due to age or medical condition  Airway Management Planned: Oral ETT  Additional Equipment:   Intra-op Plan:   Post-operative Plan: Extubation in OR and Possible Post-op intubation/ventilation  Informed Consent: I have reviewed the patients History and Physical, chart, labs and discussed the procedure including the risks, benefits and alternatives for the proposed anesthesia with the patient or authorized representative who has indicated his/her understanding and acceptance.     Dental Advisory Given  Plan Discussed with: Anesthesiologist, CRNA and Surgeon  Anesthesia Plan Comments: (Consent via interpreter   Patient consented for risks of anesthesia including but not limited to:  - adverse reactions to medications - damage to eyes, teeth, lips or other oral mucosa - nerve damage due to positioning  - sore throat or hoarseness - Damage to heart, brain, nerves, lungs or loss of life  Patient voiced understanding.)        Anesthesia Quick Evaluation

## 2019-12-03 NOTE — Op Note (Signed)
Operative Note  Preoperative Diagnosis: Anastomotic leak  Postoperative Diagnosis: Same, at the ileocolonic anastomosis.  Operation: Exploratory laparotomy, lysis of adhesions, resection of small bowel, resection of portion of colon  Surgeon: Duanne Guess, MD  Assistant: None  Anesthesia: General endotracheal  Findings: Extensively inflamed and dilated small intestine from about the mid jejunum all the way to the ileocolonic anastomosis.  There was an open hole in the colonic side of the ileocolonic anastomosis that appeared to be right at the staple line.  The rectal anastomosis was intact.  There were moderate adhesions between the loops of small bowel and enteric contents found within pockets throughout the abdomen.  Indications: This is a 54 year old man who previously underwent partial colectomy with diverting ileostomy and colostomy.  These were reversed earlier this week by Dr. Aleen Campi.  He had been doing well postoperatively, until last night when he became tachycardic, tachypneic, and developed abdominal pain.  Evaluation was concerning for potential intra-abdominal catastrophe.  After consent was obtained with the assistance of an interpreter, he was brought to the operating room for exploration.  Procedure In Detail: The patient was brought to the operating room and transferred onto the OR table.  Bony prominences were padded.  Bilateral sequential compression devices were in place.  General endotracheal anesthesia was induced.  A Foley catheter was aseptically placed by the nursing team.  A nasogastric tube was placed by the CRNA.  The midline staples were removed.  The abdomen was then sterilely prepped and draped in standard fashion.  A timeout was performed confirming the patient's identity, the procedure being performed, his allergies, all necessary equipment was available, and that maintenance anesthesia was adequate.  He had been receiving scheduled antibiotics on the floor,  therefore no additional perioperative antibiotics were administered.  The midline wound was reopened.  Upon cutting the suture, a gush of gas rushed forward.  There was fluid with a bit of bile tinge to it but no frank stool or succus was initially appreciated.  Once the abdomen was open, I began to lyse adhesions using combination of sharp dissection, finger fracture and judicious use of electrocautery.  I was able to completely mobilize the small intestine.  There was extensive inflammation and dilation of the bowel in the left upper quadrant.  I was able to identify the ileocolonic anastomosis, where frank lime green succus was being emitted.  I ran the bowel in reverse all the way back to the ligament of Treitz.  No other bowel injury or violation was identified.  I also explored into the pelvis and confirmed that the colorectal anastomosis was intact.  I did not feel like the hole in the anastomosis would be able to be repaired primarily, therefore I selected two areas, one just proximal and one just distal to the anastomosis.  Two firings of a 75 mm blue load GIA stapler were used to divide the small intestine and the colon that comprised the failed anastomosis.  The mesentery was divided with the LigaSure.  In order to gain adequate length and mobility to bring the new ends together, I opened up the previously closed mesenteric defect.  The antimesenteric surfaces of both the small intestine and colon were then brought together.  The corners of the stapled off bowel were opened and a third firing of the GIA stapler was used to create a new anastomosis.  A fourth firing closed off the open end.  I then imbricated the anastomosis with interrupted Lembert sutures.  I evaluated  the mesenteric defect and it was extremely large and I was concerned that closing it could potentially compromise the blood supply to the anastomosis, therefore I elected to leave it open.  I reevaluated the entirety of the small bowel  and found no other injury or concern for leak.  The abdomen was irrigated copiously with multiple liters of warm saline.  The intestines were returned to the abdominal cavity.  The scrub tech and I then changed our gown and gloves for a clean closure.  The fascia was closed with running #1 PDS.  As we performed the closure, we confirmed with the anesthesia provider that there was no increase in the patient's peak airway pressures.  Once the fascia was closed, the subcutaneum was irrigated and the skin was closed with staples.  A sterile dressing was applied.  The patient was awakened, extubated, and taken to the postanesthesia care unit in stable condition.  EBL: 50 cc  IVF: 2 L of crystalloid and 250 cc of 5% albumin  Specimen(s): Ileocolonic anastomosis  Complications: none immediately apparent.   Counts: all needles, instruments, and sponges were counted and reported to be correct in number at the end of the case.   I was present for and participated in the entire operation.  Fredirick Maudlin 8:03 PM

## 2019-12-03 NOTE — Progress Notes (Signed)
Pt off unit to CT at this time. MD order received to travel without RN.

## 2019-12-03 NOTE — Progress Notes (Signed)
Pharmacy Antibiotic Note  Alexander Carpenter is a 54 y.o. male admitted on 11/29/2019. Pharmacy has been consulted for vancomycin dosing.  Plan: SCr increased overnight. Change vancomycin to 1000 mg IV q24h to start tonight at 2200. Goal AUC 400-550 Expected AUC: 431 SCr used: 1.43   Height: 5\' 3"  (160 cm) Weight: 72.6 kg (160 lb) IBW/kg (Calculated) : 56.9  Temp (24hrs), Avg:100 F (37.8 C), Min:97.6 F (36.4 C), Max:103.1 F (39.5 C)  Recent Labs  Lab 11/28/19 0918 11/30/19 0528 12/02/19 0950 12/02/19 1853 12/02/19 2224 12/03/19 0331  WBC 5.7 10.3  --  2.6*  --  10.8*  CREATININE 0.92 1.17 0.74  --   --  1.43*  LATICACIDVEN  --   --   --  2.7* 3.5*  --     Estimated Creatinine Clearance: 53.4 mL/min (A) (by C-G formula based on SCr of 1.43 mg/dL (H)).    No Known Allergies  Antimicrobials this admission: Vancomycin 4/30 >> Zosyn 4/30 >>  Dose adjustments this admission: 5/1 Vanc 1000 mg q12h >> 1000 mg q24h  Microbiology results: 4/30 BCx: NG 12h   Thank you for allowing pharmacy to be a part of this patient's care.  5/30, PharmD 12/03/2019 7:21 AM

## 2019-12-03 NOTE — Anesthesia Procedure Notes (Signed)
Procedure Name: Intubation Date/Time: 12/03/2019 5:20 PM Performed by: Katherine Basset, CRNA Pre-anesthesia Checklist: Patient identified, Emergency Drugs available, Suction available and Patient being monitored Patient Re-evaluated:Patient Re-evaluated prior to induction Oxygen Delivery Method: Circle system utilized Preoxygenation: Pre-oxygenation with 100% oxygen Induction Type: IV induction and Rapid sequence Ventilation: Mask ventilation without difficulty Laryngoscope Size: Miller and 2 Grade View: Grade I Tube type: Oral Tube size: 7.5 mm Number of attempts: 1 Airway Equipment and Method: Stylet and Oral airway Placement Confirmation: ETT inserted through vocal cords under direct vision,  positive ETCO2 and breath sounds checked- equal and bilateral Secured at: 22 cm Tube secured with: Tape Dental Injury: Teeth and Oropharynx as per pre-operative assessment

## 2019-12-03 NOTE — Transfer of Care (Signed)
Immediate Anesthesia Transfer of Care Note  Patient: Alexander Carpenter  Procedure(s) Performed: EXPLORATORY LAPAROTOMY (N/A ) SMALL BOWEL RESECTION (N/A )  Patient Location: PACU  Anesthesia Type:General  Level of Consciousness: awake and alert   Airway & Oxygen Therapy: Patient Spontanous Breathing and Patient connected to face mask oxygen  Post-op Assessment: Report given to RN and Post -op Vital signs reviewed and stable  Post vital signs: Reviewed and stable  Last Vitals:  Vitals Value Taken Time  BP    Temp    Pulse 104 12/03/19 1955  Resp 20 12/03/19 1955  SpO2 100 % 12/03/19 1955  Vitals shown include unvalidated device data.  Last Pain:  Vitals:   12/03/19 1400  TempSrc: Oral  PainSc: 4          Complications: No apparent anesthesia complications

## 2019-12-04 ENCOUNTER — Inpatient Hospital Stay (HOSPITAL_COMMUNITY)
Admission: RE | Admit: 2019-12-04 | Discharge: 2019-12-04 | Disposition: A | Discharge status: Home / Self Care | Payer: Self-pay | Source: Ambulatory Visit | Attending: Internal Medicine | Admitting: Internal Medicine

## 2019-12-04 DIAGNOSIS — I10 Essential (primary) hypertension: Secondary | ICD-10-CM

## 2019-12-04 DIAGNOSIS — R9431 Abnormal electrocardiogram [ECG] [EKG]: Secondary | ICD-10-CM

## 2019-12-04 DIAGNOSIS — K9189 Other postprocedural complications and disorders of digestive system: Secondary | ICD-10-CM

## 2019-12-04 DIAGNOSIS — E119 Type 2 diabetes mellitus without complications: Secondary | ICD-10-CM

## 2019-12-04 LAB — COMPREHENSIVE METABOLIC PANEL
ALT: 26 U/L (ref 0–44)
AST: 16 U/L (ref 15–41)
Albumin: 2.6 g/dL — ABNORMAL LOW (ref 3.5–5.0)
Alkaline Phosphatase: 64 U/L (ref 38–126)
Anion gap: 7 (ref 5–15)
BUN: 17 mg/dL (ref 6–20)
CO2: 22 mmol/L (ref 22–32)
Calcium: 8.1 mg/dL — ABNORMAL LOW (ref 8.9–10.3)
Chloride: 108 mmol/L (ref 98–111)
Creatinine, Ser: 0.92 mg/dL (ref 0.61–1.24)
GFR calc Af Amer: >60 mL/min (ref >60)
GFR calc non Af Amer: >60 mL/min (ref >60)
Glucose, Bld: 213 mg/dL — ABNORMAL HIGH (ref 70–99)
Potassium: 4 mmol/L (ref 3.5–5.1)
Sodium: 137 mmol/L (ref 135–145)
Total Bilirubin: 0.9 mg/dL (ref 0.3–1.2)
Total Protein: 6.1 g/dL — ABNORMAL LOW (ref 6.5–8.1)

## 2019-12-04 LAB — CBC
HCT: 31.3 % — ABNORMAL LOW (ref 39.0–52.0)
Hemoglobin: 10.5 g/dL — ABNORMAL LOW (ref 13.0–17.0)
MCH: 29.5 pg (ref 26.0–34.0)
MCHC: 33.5 g/dL (ref 30.0–36.0)
MCV: 87.9 fL (ref 80.0–100.0)
Platelets: 245 10*3/uL (ref 150–400)
RBC: 3.56 MIL/uL — ABNORMAL LOW (ref 4.22–5.81)
RDW: 13.5 % (ref 11.5–15.5)
WBC: 13.1 10*3/uL — ABNORMAL HIGH (ref 4.0–10.5)
nRBC: 0 % (ref 0.0–0.2)

## 2019-12-04 LAB — ECHOCARDIOGRAM COMPLETE
Height: 63 in
Weight: 2560 oz

## 2019-12-04 LAB — TROPONIN I (HIGH SENSITIVITY): Troponin I (High Sensitivity): 6 ng/L (ref <18)

## 2019-12-04 LAB — MAGNESIUM: Magnesium: 2 mg/dL (ref 1.7–2.4)

## 2019-12-04 LAB — GLUCOSE, CAPILLARY
Glucose-Capillary: 210 mg/dL — ABNORMAL HIGH (ref 70–99)
Glucose-Capillary: 183 mg/dL — ABNORMAL HIGH (ref 70–99)
Glucose-Capillary: 166 mg/dL — ABNORMAL HIGH (ref 70–99)
Glucose-Capillary: 158 mg/dL — ABNORMAL HIGH (ref 70–99)
Glucose-Capillary: 130 mg/dL — ABNORMAL HIGH (ref 70–99)

## 2019-12-04 LAB — PHOSPHORUS: Phosphorus: 3.5 mg/dL (ref 2.5–4.6)

## 2019-12-04 MED ORDER — VANCOMYCIN HCL 1750 MG/350ML IV SOLN
1750.0000 mg | INTRAVENOUS | Status: DC
  Administered 2019-12-04: 1750 mg via INTRAVENOUS
  Filled 2019-12-04 (×2): qty 350

## 2019-12-04 NOTE — Anesthesia Postprocedure Evaluation (Signed)
Anesthesia Post Note  Patient: Alexander Carpenter  Procedure(s) Performed: EXPLORATORY LAPAROTOMY (N/A ) SMALL BOWEL RESECTION (N/A )  Patient location during evaluation: PACU Anesthesia Type: General Level of consciousness: awake and alert and oriented Pain management: pain level controlled Vital Signs Assessment: post-procedure vital signs reviewed and stable Respiratory status: spontaneous breathing Cardiovascular status: blood pressure returned to baseline Anesthetic complications: no     Last Vitals:  Vitals:   12/04/19 1100 12/04/19 1210  BP: 111/84 120/85  Pulse: 88 86  Resp: (!) 21 16  Temp:  37.4 C  SpO2: 98% 98%    Last Pain:  Vitals:   12/04/19 1210  TempSrc: Oral  PainSc:                  Sabine Tenenbaum

## 2019-12-04 NOTE — Progress Notes (Signed)
PROGRESS NOTE    Alexander Carpenter  ZOX:[id number] DOB: Dec 21, 1965 DOA: 11/29/2019 PCP: Shane CrutchMarkley, Linda, PA    Brief Narrative:  Alexander Carpenter is an 54 y.o. 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  He was taken back to the OR on 12/03/2019 for exploratory laparotomy with lysis of adhesions, resection of small bowel and resection of portion of colon.  Patient is at the present time recovering from his recent intervention but have some ST-T wave changes on the monitor and an EKG showed minimal ST elevation in V2 and V3.  Patient appears comfortable, lying flat with NG tube in situ and states that the pain he was experiencing prior to his reintervention is now gone.  He denies chest pain or shortness of breath.  He admits to 0 pain at this time.    Subjective: Pt laying in bed, no complaints of cp, sob. Only little abdominal pain, without n/v  Objective: Vitals:   12/04/19 0900 12/04/19 1000 12/04/19 1100 12/04/19 1210  BP: (!) 129/92 108/84 111/84 120/85  Pulse: 93 96 88 86  Resp: 15 (!) 23 (!) 21 16  Temp:    99.3 F (37.4 C)  TempSrc:    Oral  SpO2: 95% 94% 98% 98%  Weight:      Height:        Intake/Output Summary (Last 24 hours) at 12/04/2019 1432 Last data filed at 12/04/2019 1100 Gross per 24 hour  Intake 4289.09 ml  Output 2540 ml  Net 1749.09 ml   Filed Weights   11/29/19 0616  Weight: 72.6 kg    Examination:  General exam: Appears calm and comfortable , nad Respiratory system: Clear to auscultation. Respiratory effort normal. Cardiovascular system: S1 & S2 heard, RRR. No JVD, murmurs, rubs, gallops or clicks.    Gastrointestinal system: Abdomen is nondistended, soft and nontender. Normal bowel sounds heard. Central nervous system: Alert and oriented.  Mostly intact  extremities: No edema Skin: Warm dry Psychiatry: Judgement and insight appear normal. Mood & affect appropriate.     Data Reviewed: I have personally reviewed following labs and imaging studies  CBC: Recent Labs  Lab 11/30/19 0528 12/02/19 1853 12/03/19 0331 12/03/19 2254 12/04/19 0435  WBC 10.3 2.6* 10.8* 11.1* 13.1*  NEUTROABS 8.6*  --   --   --   --   HGB 12.3* 12.4* 12.0* 10.3* 10.5*  HCT 37.1* 37.7* 35.4* 31.4* 31.3*  MCV 88.1 88.9 87.8 90.0 87.9  PLT 200 221 240 217 245   Basic Metabolic Panel: Recent Labs  Lab 11/28/19 0918 11/30/19 0528 12/02/19 0950 12/03/19 0331 12/04/19 0435  NA 135 140 137 135 137  K 3.9 3.0* 3.3* 3.7 4.0  CL 100 110 104 105 108  CO2 25 22 23  21* 22  GLUCOSE 236* 160* 159* 212* 213*  BUN 17 18 13 20 17   CREATININE 0.92 1.17 0.74 1.43* 0.92  CALCIUM 8.7* 7.4* 8.5* 8.2* 8.1*  MG  --  2.0  --  1.7 2.0  PHOS  --   --   --  4.6 3.5   GFR: Estimated Creatinine Clearance: 83 mL/min (by C-G formula based on SCr of 0.92 mg/dL). Liver Function Tests: Recent Labs  Lab 12/04/19 0435  AST  16  ALT 26  ALKPHOS 64  BILITOT 0.9  PROT 6.1*  ALBUMIN 2.6*   No results for input(s): LIPASE, AMYLASE in the last 168 hours. No results for input(s): AMMONIA in the last 168 hours. Coagulation Profile: Recent Labs  Lab 12/03/19 2254  INR 1.2   Cardiac Enzymes: No results for input(s): CKTOTAL, CKMB, CKMBINDEX, TROPONINI in the last 168 hours. BNP (last 3 results) No results for input(s): PROBNP in the last 8760 hours. HbA1C: No results for input(s): HGBA1C in the last 72 hours. CBG: Recent Labs  Lab 12/03/19 2120 12/03/19 2354 12/04/19 0610 12/04/19 0756 12/04/19 1111  GLUCAP 203* 220* 210* 183* 166*   Lipid Profile: Recent Labs    12/03/19 2254  CHOL 86  HDL 21*  LDLCALC  37  TRIG 139  CHOLHDL 4.1   Thyroid Function Tests: No results for input(s): TSH, T4TOTAL, FREET4, T3FREE, THYROIDAB in the last 72 hours. Anemia Panel: No results for input(s): VITAMINB12, FOLATE, FERRITIN, TIBC, IRON, RETICCTPCT in the last 72 hours. Sepsis Labs: Recent Labs  Lab 12/02/19 1853 12/02/19 2224 12/03/19 0734 12/03/19 1104  LATICACIDVEN 2.7* 3.5* 1.9 1.6    Recent Results (from the past 240 hour(s))  SARS CORONAVIRUS 2 (TAT 6-24 HRS) Nasopharyngeal Nasopharyngeal Swab     Status: None   Collection Time: 11/28/19  9:18 AM   Specimen: Nasopharyngeal Swab  Result Value Ref Range Status   SARS Coronavirus 2 NEGATIVE NEGATIVE Final    Comment: (NOTE) SARS-CoV-2 target nucleic acids are NOT DETECTED. The SARS-CoV-2 RNA is generally detectable in upper and lower respiratory specimens during the acute phase of infection. Negative results do not preclude SARS-CoV-2 infection, do not rule out co-infections with other pathogens, and should not be used as the sole basis for treatment or other patient management decisions. Negative results must be combined with clinical observations, patient history, and epidemiological information. The expected result is Negative. Fact Sheet for Patients: HairSlick.no Fact Sheet for Healthcare Providers: quierodirigir.com This test is not yet approved or cleared by the [place] FDA and  has been authorized for detection and/or diagnosis of SARS-CoV-2 by FDA under an Emergency Use Authorization (EUA). This EUA will remain  in effect (meaning this test can be used) for the duration of the COVID-19 declaration under Section 56 4(b)(1) of the Act, 21 U.S.C. section 360bbb-3(b)(1), unless the authorization is terminated or revoked sooner. Performed at [facility], [address] [place], [place] [address]   Culture, blood (routine x 2)     Status: None (Preliminary result)     Collection Time: 12/02/19  7:14 PM   Specimen: BLOOD  Result Value Ref Range Status   Specimen Description BLOOD BLOOD LEFT HAND  Final   Special Requests   Final    BOTTLES DRAWN AEROBIC AND ANAEROBIC Blood Culture adequate volume   Culture   Final    NO GROWTH 2 DAYS Performed at [facility], [address], [place], [place] [address]    Report Status PENDING  Incomplete  Culture, blood (routine x 2)     Status: None (Preliminary result)   Collection Time: 12/02/19  7:14 PM   Specimen: BLOOD  Result Value Ref Range Status   Specimen Description BLOOD BLOOD RIGHT HAND  Final   Special Requests Blood Culture adequate volume  Final   Culture   Final    NO GROWTH 2 DAYS Performed at [facility], [address] [place], [place] [address]  Report Status PENDING  Incomplete         Radiology Studies: DG Abd 1 View  Result Date: 12/02/2019 CLINICAL DATA:  Abdominal distension right-sided. Recent colostomy reversal with ileostomy takedown and umbilical/peristomal hernia repair 11/29/2019. EXAM: ABDOMEN - 1 VIEW COMPARISON:  CT 04/07/2019 FINDINGS: Multiple surgical skin staples over the abdomen/pelvis. Multiple air-filled bowel loops most of which likely represent colon. Possible dilatation of several air-filled central small bowel loops likely postoperative ileus. Findings demonstrating evidence of free peritoneal air compatible with patient's recent surgery. Remainder the exam is unchanged. IMPRESSION: Postsurgical change as well as free peritoneal air compatible patient's recent abdominal surgery. Multiple air-filled bowel loops with suggestion of mild dilatation of a few central small bowel loops likely postoperative ileus. Recommend follow-up as clinically indicated. Electronically Signed   By: Elberta Fortis M.D.   On: 12/02/2019 18:36   CT ABDOMEN PELVIS W CONTRAST  Result Date: 12/03/2019 CLINICAL DATA:  Abdominal pain. Ileostomy, colostomy, and  peristomal hernia. EXAM: CT ABDOMEN AND PELVIS WITH CONTRAST TECHNIQUE: Multidetector CT imaging of the abdomen and pelvis was performed using the standard protocol following bolus administration of intravenous contrast. CONTRAST:  OMNIPAQUE IOHEXOL 300 MG/ML  SOLN COMPARISON:  04/07/2019 FINDINGS: Lower chest: There is a small amount of atelectasis at the lung bases bilaterally.The heart size is normal. Hepatobiliary: The liver is normal. Normal gallbladder.There is no biliary ductal dilation. Pancreas: Normal contours without ductal dilatation. No peripancreatic fluid collection. Spleen: There is a low-attenuation area within the spleen which may represent a splenic injury related to retraction. There is no evidence for a perisplenic hematoma or active extravasation. Adrenals/Urinary Tract: --Adrenal glands: Unremarkable. --Right kidney/ureter: No hydronephrosis or radiopaque kidney stones. --Left kidney/ureter: No hydronephrosis or radiopaque kidney stones. --Urinary bladder: Unremarkable. Stomach/Bowel: --Stomach/Duodenum: The stomach is moderately distended with contrast. 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 There are dilated fluid-filled loops of small bowel scattered throughout the abdomen. There appears to be a gradual transition point towards the coloenteric anastomosis in the mid abdomen. The anastomosis appears to be patent. --Colon: The patient is status post right hemicolectomy with colostomy takedown. There is a surgical anastomosis at the rectosigmoid junction. Liquid stool is noted in the colon. There is no extraluminal oral contrast. --Appendix: Surgically absent. Vascular/Lymphatic: Normal course and caliber of the major abdominal vessels. --No retroperitoneal lymphadenopathy. --No mesenteric lymphadenopathy. --No pelvic or inguinal lymphadenopathy. Reproductive: Unremarkable Other: There is a moderate amount of free air in the abdomen. There is a  air and fluid collection in the mid abdomen measuring approximately 4.4 x 4.3 cm (axial series 2, image 48). This does not yet appear to represent an abscess. There is a small volume of free fluid scattered throughout the abdomen. Mesenteric edema is noted. Midline staples are noted. Staples are noted over the right lower quadrant ileostomy takedown. Surgical staples are noted in the colostomy takedown region. Surgical drains are noted in the abdominal wall without evidence for a developing abscess. Musculoskeletal. No acute displaced fractures. IMPRESSION: 1. There are interval postsurgical changes related to ileostomy and colostomy takedown as detailed above. 2. Mild dilatation of the small bowel without evidence for distinct transition point. This is consistent with a postoperative ileus. 3. There is a moderate amount of free fluid and free air in the abdomen, presumably related to recent surgical intervention. There is no extraluminal oral contrast, however oral contrast is not yet reached either anastomosis. As such, a postoperative leak remains within the  differential diagnosis. Electronically Signed   By: Katherine Mantle M.D.   On: 12/03/2019 15:17   DG Chest Port 1 View  Result Date: 12/02/2019 CLINICAL DATA:  Tachycardia with chest discomfort tonight. Three days post ileostomy collision. EXAM: PORTABLE CHEST 1 VIEW COMPARISON:  Abdominal radiograph earlier this day. Chest radiograph 04/07/2019 FINDINGS: Low lung volumes. Upper normal heart size with unchanged mediastinal contours. Mild bibasilar atelectasis without confluent airspace disease. No pulmonary edema. No significant pleural effusion. No pneumothorax. Free air in the upper abdomen as characterized on recent radiograph, likely related to recent abdominal surgery. IMPRESSION: Low lung volumes with bibasilar atelectasis. Electronically Signed   By: Narda Rutherford M.D.   On: 12/02/2019 18:56   ECHOCARDIOGRAM COMPLETE  Result Date:  12/04/2019    ECHOCARDIOGRAM REPORT   Patient Name:   JAQUIL TODT Date of Exam: 12/04/2019 Medical Rec #:  [id number]                Height:       63.0 in Accession #:    [id number]               Weight:       160.0 lb Date of Birth:  1965/12/18               BSA:          1.759 m Patient Age:    53 years                 BP:           131/91 mmHg Patient Gender: M                        HR:           95 bpm. Exam Location:  [facility] Procedure: 2D Echo Indications:     Abnormal ECG 794.31/ R94.31  History:         Patient has no prior history of Echocardiogram examinations.  Sonographer:     Wonda Cerise RDCS Referring Phys:  [id number] Lynn Ito Diagnosing Phys: Chilton Si MD  Sonographer Comments: Suboptimal parasternal window. Image acquisition challenging due to respiratory motion. IMPRESSIONS  1. Left ventricular ejection fraction, by estimation, is 60 to 65%. The left ventricle has normal function. The left ventricle has no regional wall motion abnormalities. There is mild concentric left ventricular hypertrophy. Left ventricular diastolic parameters are consistent with Grade I diastolic dysfunction (impaired relaxation). Elevated left ventricular end-diastolic pressure.  2. Right ventricular systolic function is normal. The right ventricular size is normal. There is normal pulmonary artery systolic pressure.  3. The mitral valve is normal in structure. Trivial mitral valve regurgitation. No evidence of mitral stenosis.  4. The aortic valve is tricuspid. Aortic valve regurgitation is not visualized. No aortic stenosis is present.  5. The inferior vena cava is normal in size with greater than 50% respiratory variability, suggesting right atrial pressure of 3 mmHg. FINDINGS  Left Ventricle: Left ventricular ejection fraction, by estimation, is 60 to 65%. The left ventricle has normal function. The left ventricle has no regional wall motion abnormalities. The left ventricular internal cavity size was  normal in size. There is  mild concentric left ventricular hypertrophy. Left ventricular diastolic parameters are consistent with Grade I diastolic dysfunction (impaired relaxation). Elevated left ventricular end-diastolic pressure. Right Ventricle: The right ventricular size is normal. No increase in right ventricular wall thickness. Right ventricular systolic function is normal. There is  normal pulmonary artery systolic pressure. The tricuspid regurgitant velocity is 2.35 m/s, and  with an assumed right atrial pressure of 3 mmHg, the estimated right ventricular systolic pressure is 25.1 mmHg. Left Atrium: Left atrial size was normal in size. Right Atrium: Right atrial size was normal in size. Pericardium: There is no evidence of pericardial effusion. Mitral Valve: The mitral valve is normal in structure. Normal mobility of the mitral valve leaflets. Trivial mitral valve regurgitation. No evidence of mitral valve stenosis. Tricuspid Valve: The tricuspid valve is normal in structure. Tricuspid valve regurgitation is trivial. No evidence of tricuspid stenosis. Aortic Valve: The aortic valve is tricuspid. Aortic valve regurgitation is not visualized. No aortic stenosis is present. Aortic valve peak gradient measures 9.0 mmHg. Pulmonic Valve: The pulmonic valve was normal in structure. Pulmonic valve regurgitation is not visualized. No evidence of pulmonic stenosis. Aorta: The aortic root is normal in size and structure. Venous: The inferior vena cava is normal in size with greater than 50% respiratory variability, suggesting right atrial pressure of 3 mmHg. IAS/Shunts: No atrial level shunt detected by color flow Doppler.  LEFT VENTRICLE PLAX 2D LVIDd:         4.71 cm  Diastology LVIDs:         3.19 cm  LV e' lateral:   8.59 cm/s LV PW:         1.03 cm  LV E/e' lateral: 16.6 LV IVS:        1.04 cm  LV e' medial:    7.83 cm/s LVOT diam:     2.00 cm  LV E/e' medial:  18.3 LV SV:         59 LV SV Index:   34 LVOT Area:      3.14 cm  RIGHT VENTRICLE RV Basal diam:  3.16 cm RV S prime:     18.70 cm/s TAPSE (M-mode): 3.3 cm LEFT ATRIUM             Index       RIGHT ATRIUM           Index LA diam:        3.80 cm 2.16 cm/m  RA Area:     13.50 cm LA Vol (A2C):   38.7 ml 22.00 ml/m RA Volume:   30.40 ml  17.29 ml/m LA Vol (A4C):   21.2 ml 12.05 ml/m LA Biplane Vol: 29.0 ml 16.49 ml/m  AORTIC VALVE AV Area (Vmax): 2.14 cm AV Vmax:        150.00 cm/s AV Peak Grad:   9.0 mmHg LVOT Vmax:      102.00 cm/s LVOT Vmean:     63.600 cm/s LVOT VTI:       0.188 m  AORTA Ao Root diam: 2.90 cm Ao Asc diam:  2.80 cm MITRAL VALVE                TRICUSPID VALVE MV Area (PHT): 2.69 cm     TR Peak grad:   22.1 mmHg MV Decel Time: 282 msec     TR Vmax:        235.00 cm/s MV E velocity: 143.00 cm/s MV A velocity: 152.00 cm/s  SHUNTS MV E/A ratio:  0.94         Systemic VTI:  0.19 m                             Systemic Diam: 2.00 cm Tiffany  Oval Linsey MD Electronically signed by Skeet Latch MD Signature Date/Time: 12/04/2019/12:30:44 PM    Final         Scheduled Meds: . acetaminophen  1,000 mg Oral Q6H  . Chlorhexidine Gluconate Cloth  6 each Topical Daily  . enoxaparin (LOVENOX) injection  40 mg Subcutaneous Q24H  . insulin aspart  0-15 Units Subcutaneous TID WC  . insulin aspart  0-5 Units Subcutaneous QHS  . pantoprazole  40 mg Oral Daily   Continuous Infusions: . 0.9 % NaCl with KCl 20 mEq / L 100 mL/hr at 12/04/19 1100  . piperacillin-tazobactam (ZOSYN)  IV 3.375 g (12/04/19 1422)  . vancomycin      Assessment & Plan:   Active Problems:   Colostomy in place [facility])   Ileostomy in place [facility])   Ileocolic anastomotic leak   Abnormal EKG   Abnormal EKG -EKG as interpreted by me appears to have nonspecific ST-T wave changes -Patient denies chest pain, shortness of breath, appears comfortable, vitals are stable -Troponin negative.  Echo-EF nml, no wall motion abnormalities. Grade I diastolic dysfunction. Mild concentric  LVH.  No further cardiac workup at this time.  Will sign off , please call with questions.  Status post laparotomy -Management per surgery      Will sign off , please call with questions.     LOS: 5 days   Time spent: 35 min with >50 % coc    Nolberto Hanlon, MD [facility] Pager [phone]  If 7PM-7AM, please contact night-coverage [email or web address] Password [facility] 12/04/2019, 2:32 PM

## 2019-12-04 NOTE — Consult Note (Signed)
Medical Consultation   Alexander Carpenter  ZOX:[id number]  DOB: [birth year]  DOA: 11/29/2019  PCP: Shane CrutchMarkley, Linda, PA   Requesting physician: Dr. Lady Garyannon, surgery  Reason for consultation: Abnormal EKG   History of Present Illness: Alexander Carpenter is an 54 y.o. 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  He was taken back to the OR on 12/03/2019 for exploratory laparotomy with lysis of adhesions, resection of small bowel and resection of portion of colon.  Patient is at the present time recovering from his recent intervention but have some ST-T wave changes on the monitor and an EKG showed minimal ST elevation in V2 and V3.  Patient appears comfortable, lying flat with NG tube in situ and states that the pain he was experiencing prior to his reintervention is now gone.  He denies chest pain or shortness of breath.  He admits to 0 pain at this time.  Review of Systems:  ROS As per HPI otherwise 10 point review of systems negative.   Past Medical History: Past Medical History:  Diagnosis Date  . Anemia   . Diabetes (HCC)   . Diverticulitis of large intestine with perforation   . Gastritis   . GERD (gastroesophageal reflux disease)   . Hyperlipidemia     Past Surgical History: Past Surgical History:  Procedure Laterality Date  . COLON SURGERY    . COLONOSCOPY WITH PROPOFOL N/A 03/01/2019   Procedure: COLONOSCOPY WITH PROPOFOL;  Surgeon: Pasty Spillersahiliani, Varnita B, MD;  [facility];  Service: Endoscopy;  Laterality: N/A;  colon scope out of rectum @ 0919  and back in stoma @ 0921  . COLOSTOMY    . COLOSTOMY REVERSAL  N/A 11/29/2019   Procedure: COLOSTOMY REVERSAL;  Surgeon: Henrene DodgePiscoya, Jose, MD;  Location: [facility];  Service: General;  Laterality: N/A;  . ILEOSTOMY CLOSURE N/A 11/29/2019   Procedure: ILEOSTOMY TAKEDOWN;  Surgeon: Henrene DodgePiscoya, Jose, MD;  Location: [facility];  Service: General;  Laterality: N/A;  . LAPAROTOMY N/A 06/24/2018   Procedure: EXPLORATORY LAPAROTOMY;  Surgeon: Henrene DodgePiscoya, Jose, MD;  Location: [facility];  Service: General;  Laterality: N/A;  . PARASTOMAL HERNIA REPAIR N/A 11/29/2019   Procedure: HERNIA REPAIR PARASTOMAL;  Surgeon: Henrene DodgePiscoya, Jose, MD;  Location: [facility];  Service: General;  Laterality: N/A;  . UMBILICAL HERNIA REPAIR  11/29/2019   Procedure: HERNIA REPAIR UMBILICAL ADULT;  Surgeon: Henrene DodgePiscoya, Jose, MD;  Location: [facility];  Service: General;;     Allergies:  No Known Allergies   Social History:  reports that he has been smoking cigarettes. He has never used smokeless tobacco. He reports current alcohol use of about 3.0 standard drinks of alcohol per week. He reports current drug use. Drug: Marijuana.   Family History: Family History  Problem Relation Age of Onset  . Diabetes Brother   . Diabetes Maternal Uncle     Unacceptable: Noncontributory, unremarkable, or negative. Acceptable: Family history reviewed and not pertinent (If you reviewed it)   Physical Exam: Vitals:   12/03/19 2019 12/03/19 2020 12/03/19 2032 12/03/19 2045  BP:  113/85    Pulse: (!) 103 95 (!) 104   Resp: 20 18 14  17  Temp:   98.7 F (37.1 C)   TempSrc:      SpO2: 98% 98% 98% 96%  Weight:      Height:        Constitutional: Appearance,  Alert and awake, oriented x3, not in any acute distress. Eyes: PERLA, EOMI, irises appear normal, anicteric sclera,  ENMT: external ears and nose appear normal, normal hearing or hard of hearing            Lips appears normal, oropharynx mucosa, tongue, posterior pharynx appear normal, NG tube draining green liquid Neck: neck appears normal, no masses,  normal ROM, no thyromegaly, no JVD  CVS: S1-S2 clear, no murmur rubs or gallops, no LE edema, normal pedal pulses  Respiratory:  clear to auscultation bilaterally, no wheezing, rales or rhonchi. Respiratory effort normal. No accessory muscle use.  Abdomen: soft.  Surgical wound with clean dressing Musculoskeletal: : no cyanosis, clubbing or edema noted bilaterally Neuro: Grossly intact Psych: judgement and insight appear normal, stable mood and affect, mental status Skin: no rashes or lesions or ulcers, no induration or nodules   Data reviewed:  I have personally reviewed following labs and imaging studies Labs:  CBC: Recent Labs  Lab 11/28/19 0918 11/30/19 0528 12/02/19 1853 12/03/19 0331 12/03/19 2254  WBC 5.7 10.3 2.6* 10.8* 11.1*  NEUTROABS  --  8.6*  --   --   --   HGB 14.7 12.3* 12.4* 12.0* 10.3*  HCT 44.0 37.1* 37.7* 35.4* 31.4*  MCV 87.8 88.1 88.9 87.8 90.0  PLT 251 200 221 240 217    Basic Metabolic Panel: Recent Labs  Lab 11/28/19 0918 11/28/19 0918 11/30/19 0528 11/30/19 0528 12/02/19 0950 12/03/19 0331  NA 135  --  140  --  137 135  K 3.9   < > 3.0*   < > 3.3* 3.7  CL 100  --  110  --  104 105  CO2 25  --  22  --  23 21*  GLUCOSE 236*  --  160*  --  159* 212*  BUN 17  --  18  --  13 20  CREATININE 0.92  --  1.17  --  0.74 1.43*  CALCIUM 8.7*  --  7.4*  --  8.5* 8.2*  MG  --   --  2.0  --   --  1.7  PHOS  --   --   --   --   --  4.6   < > = values in this interval not displayed.   GFR Estimated Creatinine Clearance: 53.4 mL/min (A) (by C-G formula based on SCr of 1.43 mg/dL (H)). Liver Function Tests: No results for input(s): AST, ALT, ALKPHOS, BILITOT, PROT, ALBUMIN in the last 168 hours. No results for input(s): LIPASE, AMYLASE in the last 168 hours. No results for input(s): AMMONIA in the last 168 hours. Coagulation profile Recent Labs  Lab 12/03/19 2254  INR 1.2    Cardiac Enzymes: No results for input(s): CKTOTAL, CKMB, CKMBINDEX,  TROPONINI in the last 168 hours. BNP: Invalid input(s): POCBNP CBG: Recent Labs  Lab 12/03/19 1145 12/03/19 1602 12/03/19 2005 12/03/19 2120 12/03/19 2354  GLUCAP 133* 150* 181* 203* 220*   D-Dimer No results for input(s): DDIMER in the last 72 hours. Hgb A1c No results for input(s): HGBA1C in the last 72 hours. Lipid Profile Recent Labs    12/03/19 2254  CHOL 86  HDL 21*  LDLCALC 37  TRIG 944  CHOLHDL 4.1   Thyroid function  studies No results for input(s): TSH, T4TOTAL, T3FREE, THYROIDAB in the last 72 hours.  Invalid input(s): FREET3 Anemia work up No results for input(s): VITAMINB12, FOLATE, FERRITIN, TIBC, IRON, RETICCTPCT in the last 72 hours. Urinalysis    Component Value Date/Time   COLORURINE YELLOW (A) 04/07/2019 2144   APPEARANCEUR CLEAR (A) 04/07/2019 2144   LABSPEC 1.016 04/07/2019 2144   PHURINE 5.0 04/07/2019 2144   GLUCOSEU NEGATIVE 04/07/2019 2144   HGBUR NEGATIVE 04/07/2019 2144   BILIRUBINUR NEGATIVE 04/07/2019 2144   KETONESUR 20 (A) 04/07/2019 2144   PROTEINUR NEGATIVE 04/07/2019 2144   NITRITE NEGATIVE 04/07/2019 2144   LEUKOCYTESUR NEGATIVE 04/07/2019 2144     Microbiology Recent Results (from the past 240 hour(s))  SARS CORONAVIRUS 2 (TAT 6-24 HRS) Nasopharyngeal Nasopharyngeal Swab     Status: None   Collection Time: 11/28/19  9:18 AM   Specimen: Nasopharyngeal Swab  Result Value Ref Range Status   SARS Coronavirus 2 NEGATIVE NEGATIVE Final    Comment: (NOTE) SARS-CoV-2 target nucleic acids are NOT DETECTED. The SARS-CoV-2 RNA is generally detectable in upper and lower respiratory specimens during the acute phase of infection. Negative results do not preclude SARS-CoV-2 infection, do not rule out co-infections with other pathogens, and should not be used as the sole basis for treatment or other patient management decisions. Negative results must be combined with clinical observations, patient history, and epidemiological  information. The expected result is Negative. Fact Sheet for Patients: HairSlick.no Fact Sheet for Healthcare Providers: quierodirigir.com This test is not yet approved or cleared by the [place] FDA and  has been authorized for detection and/or diagnosis of SARS-CoV-2 by FDA under an Emergency Use Authorization (EUA). This EUA will remain  in effect (meaning this test can be used) for the duration of the COVID-19 declaration under Section 56 4(b)(1) of the Act, 21 U.S.C. section 360bbb-3(b)(1), unless the authorization is terminated or revoked sooner. Performed at [facility], [address] [place], [place] [address]   Culture, blood (routine x 2)     Status: None (Preliminary result)   Collection Time: 12/02/19  7:14 PM   Specimen: BLOOD  Result Value Ref Range Status   Specimen Description BLOOD BLOOD LEFT HAND  Final   Special Requests   Final    BOTTLES DRAWN AEROBIC AND ANAEROBIC Blood Culture adequate volume   Culture   Final    NO GROWTH < 12 HOURS Performed at [facility], [address] [place], [place] [address]    Report Status PENDING  Incomplete  Culture, blood (routine x 2)     Status: None (Preliminary result)   Collection Time: 12/02/19  7:14 PM   Specimen: BLOOD  Result Value Ref Range Status   Specimen Description BLOOD BLOOD RIGHT HAND  Final   Special Requests Blood Culture adequate volume  Final   Culture   Final    NO GROWTH < 12 HOURS Performed at [facility], [address], [place], [place] [address]    Report Status PENDING  Incomplete       Inpatient Medications:   Scheduled Meds: . acetaminophen  1,000 mg Oral Q6H  . Chlorhexidine Gluconate Cloth  6 each Topical Daily  . enoxaparin (LOVENOX) injection  40 mg Subcutaneous Q24H  . insulin aspart  0-15 Units Subcutaneous TID WC  . insulin aspart  0-5 Units Subcutaneous QHS  . pantoprazole  40 mg Oral  Daily   Continuous Infusions: . 0.9 % NaCl with  KCl 20 mEq / L 100 mL/hr at 12/03/19 2220  . piperacillin-tazobactam (ZOSYN)  IV 3.375 g (12/03/19 2327)  . vancomycin 1,000 mg (12/03/19 2223)     Radiological Exams on Admission: DG Abd 1 View  Result Date: 12/02/2019 CLINICAL DATA:  Abdominal distension right-sided. Recent colostomy reversal with ileostomy takedown and umbilical/peristomal hernia repair 11/29/2019. EXAM: ABDOMEN - 1 VIEW COMPARISON:  CT 04/07/2019 FINDINGS: Multiple surgical skin staples over the abdomen/pelvis. Multiple air-filled bowel loops most of which likely represent colon. Possible dilatation of several air-filled central small bowel loops likely postoperative ileus. Findings demonstrating evidence of free peritoneal air compatible with patient's recent surgery. Remainder the exam is unchanged. IMPRESSION: Postsurgical change as well as free peritoneal air compatible patient's recent abdominal surgery. Multiple air-filled bowel loops with suggestion of mild dilatation of a few central small bowel loops likely postoperative ileus. Recommend follow-up as clinically indicated. Electronically Signed   By: Marin Olp M.D.   On: 12/02/2019 18:36   CT ABDOMEN PELVIS W CONTRAST  Result Date: 12/03/2019 CLINICAL DATA:  Abdominal pain. Ileostomy, colostomy, and peristomal hernia. EXAM: CT ABDOMEN AND PELVIS WITH CONTRAST TECHNIQUE: Multidetector CT imaging of the abdomen and pelvis was performed using the standard protocol following bolus administration of intravenous contrast. CONTRAST:  122mL OMNIPAQUE IOHEXOL 300 MG/ML  SOLN COMPARISON:  04/07/2019 FINDINGS: Lower chest: There is a small amount of atelectasis at the lung bases bilaterally.The heart size is normal. Hepatobiliary: The liver is normal. Normal gallbladder.There is no biliary ductal dilation. Pancreas: Normal contours without ductal dilatation. No peripancreatic fluid collection. Spleen: There is a low-attenuation area  within the spleen which may represent a splenic injury related to retraction. There is no evidence for a perisplenic hematoma or active extravasation. Adrenals/Urinary Tract: --Adrenal glands: Unremarkable. --Right kidney/ureter: No hydronephrosis or radiopaque kidney stones. --Left kidney/ureter: No hydronephrosis or radiopaque kidney stones. --Urinary bladder: Unremarkable. Stomach/Bowel: --Stomach/Duodenum: The stomach is moderately distended with contrast. 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 There are dilated fluid-filled loops of small bowel scattered throughout the abdomen. There appears to be a gradual transition point towards the coloenteric anastomosis in the mid abdomen. The anastomosis appears to be patent. --Colon: The patient is status post right hemicolectomy with colostomy takedown. There is a surgical anastomosis at the rectosigmoid junction. Liquid stool is noted in the colon. There is no extraluminal oral contrast. --Appendix: Surgically absent. Vascular/Lymphatic: Normal course and caliber of the major abdominal vessels. --No retroperitoneal lymphadenopathy. --No mesenteric lymphadenopathy. --No pelvic or inguinal lymphadenopathy. Reproductive: Unremarkable Other: There is a moderate amount of free air in the abdomen. There is a air and fluid collection in the mid abdomen measuring approximately 4.4 x 4.3 cm (axial series 2, image 48). This does not yet appear to represent an abscess. There is a small volume of free fluid scattered throughout the abdomen. Mesenteric edema is noted. Midline staples are noted. Staples are noted over the right lower quadrant ileostomy takedown. Surgical staples are noted in the colostomy takedown region. Surgical drains are noted in the abdominal wall without evidence for a developing abscess. Musculoskeletal. No acute displaced fractures. IMPRESSION: 1. There are interval postsurgical changes related to ileostomy and  colostomy takedown as detailed above. 2. Mild dilatation of the small bowel without evidence for distinct transition point. This is consistent with a postoperative ileus. 3. There is a moderate amount of free fluid and free air in the abdomen, presumably related to recent surgical intervention. There is no  extraluminal oral contrast, however oral contrast is not yet reached either anastomosis. As such, a postoperative leak remains within the differential diagnosis. Electronically Signed   By: Katherine Mantle M.D.   On: 12/03/2019 15:17   DG Chest Port 1 View  Result Date: 12/02/2019 CLINICAL DATA:  Tachycardia with chest discomfort tonight. Three days post ileostomy collision. EXAM: PORTABLE CHEST 1 VIEW COMPARISON:  Abdominal radiograph earlier this day. Chest radiograph 04/07/2019 FINDINGS: Low lung volumes. Upper normal heart size with unchanged mediastinal contours. Mild bibasilar atelectasis without confluent airspace disease. No pulmonary edema. No significant pleural effusion. No pneumothorax. Free air in the upper abdomen as characterized on recent radiograph, likely related to recent abdominal surgery. IMPRESSION: Low lung volumes with bibasilar atelectasis. Electronically Signed   By: Narda Rutherford M.D.   On: 12/02/2019 18:56    Impression/Recommendations  Abnormal EKG -EKG as interpreted by me appears to have nonspecific ST-T wave changes -Patient denies chest pain, shortness of breath, appears comfortable, vitals are stable -Troponin 5.  Follow other routine labs -Low suspicion for ACS -Can consider echocardiogram to evaluate for wall motion abnormality  Status post laparotomy -Management per surgery     Thank you for this consultation.  Our [facility] hospitalist team will follow the patient with you.   Time Spent: 68  Andris Baumann M.D. [facility] 12/04/2019, 12:04 AM

## 2019-12-04 NOTE — Progress Notes (Signed)
Pharmacy Antibiotic Note  Alexander Carpenter is a 54 y.o. male admitted on 11/29/2019. Pharmacy has been consulted for vancomycin dosing.  Plan: SCr improved. Change vancomycin to 1750 mg IV q24h to start tonight at 2000. Goal AUC 400-550 Expected AUC: 504 SCr used: 0.92   Height: 5\' 3"  (160 cm) Weight: 72.6 kg (160 lb) IBW/kg (Calculated) : 56.9  Temp (24hrs), Avg:98.5 F (36.9 C), Min:98.2 F (36.8 C), Max:98.9 F (37.2 C)  Recent Labs  Lab 11/28/19 0918 11/28/19 0918 11/30/19 0528 12/02/19 0950 12/02/19 1853 12/02/19 2224 12/03/19 0331 12/03/19 0734 12/03/19 1104 12/03/19 2254 12/04/19 0435  WBC 5.7   < > 10.3  --  2.6*  --  10.8*  --   --  11.1* 13.1*  CREATININE 0.92  --  1.17 0.74  --   --  1.43*  --   --   --  0.92  LATICACIDVEN  --   --   --   --  2.7* 3.5*  --  1.9 1.6  --   --    < > = values in this interval not displayed.    Estimated Creatinine Clearance: 83 mL/min (by C-G formula based on SCr of 0.92 mg/dL).    No Known Allergies  Antimicrobials this admission: Vancomycin 4/30 >> Zosyn 4/30 >>  Dose adjustments this admission: 5/1 Vanc 1000 mg q12h >> 1000 mg q24h 5/2 Vanc 1000 mg q24h >> 1250 mg q24h  Microbiology results: 4/30 BCx: NGTD   Thank you for allowing pharmacy to be a part of this patient's care.  5/30, PharmD 12/04/2019 8:02 AM

## 2019-12-04 NOTE — Plan of Care (Signed)
Continuing with plan of care. 

## 2019-12-04 NOTE — Progress Notes (Signed)
[facility] SURGICAL PROGRESS NOTE  Hospital Day(s): 5.   Post op day(s): 1 Day Post-Op from exploratory laparotomy, adhesiolysis, partial colon resection, small bowel resection and repair of anastomosis.; 5 days postop from ileostomy and colostomy reversal  Interval History:  Patient seen and examined.  Last night after returning to the ICU, he was noted to have some ST segment changes by the nurse looking after him.  Hospital medicine was consulted.  Troponins were negative and Dr. Para March determined that these were some nonspecific changes that have now resolved.  At the time of my evaluation, he is getting ready to have an echocardiogram.  He reports that his abdominal pain has largely resolved, now just complaining of some mild incisional pain.  His NG tube is coiled in his mouth.  He denies any nausea or vomiting.   Vital signs in last 24 hours: [min-max] current  Temp:  [98.1 F (36.7 C)-98.9 F (37.2 C)] 98.1 F (36.7 C) (05/02 0800) Pulse Rate:  [88-113] 88 (05/02 1100) Resp:  [12-23] 21 (05/02 1100) BP: (96-136)/(66-97) 111/84 (05/02 1100) SpO2:  [92 %-100 %] 98 % (05/02 1100)     Height: 5\' 3"  (160 cm) Weight: 72.6 kg BMI (Calculated): 28.35   Intake/Output last 2 shifts:  05/01 0701 - 05/02 0700 In: 6026.2 [I.V.:4332.4; IV Piggyback:1693.9] Out: 1590 [Urine:1440; Emesis/NG output:100; Blood:50]   Physical Exam:  Constitutional: alert, cooperative and no distress  Respiratory: breathing non-labored at rest  Cardiovascular: regular rate and sinus rhythm  Gastrointestinal:Soft,incisional soreness,  distention significantly improved.  No peritonitis. Integumentary:Laparotomy and ostomy incisions are healing well, staples, penrose drains in ostomy sites, no erythema or drainage   Labs:  CBC Latest Ref Rng & Units 12/04/2019 12/03/2019 12/03/2019  WBC 4.0 - 10.5 K/uL 13.1(H) 11.1(H) 10.8(H)  Hemoglobin 13.0 - 17.0 g/dL 10.5(L) 10.3(L) 12.0(L)  Hematocrit  39.0 - 52.0 % 31.3(L) 31.4(L) 35.4(L)  Platelets 150 - 400 K/uL 245 217 240   CMP Latest Ref Rng & Units 12/04/2019 12/03/2019 12/02/2019  Glucose 70 - 99 mg/dL 12/04/2019) 606(T) 016(W)  BUN 6 - 20 mg/dL 17 20 13   Creatinine 0.61 - 1.24 mg/dL 109(N ) 2.35  Sodium 135 - 145 mmol/L 137 135 137  Potassium 3.5 - 5.1 mmol/L 4.0 3.7 3.3(L)  Chloride 98 - 111 mmol/L 108 105 104  CO2 22 - 32 mmol/L 22 21(L) 23  Calcium 8.9 - 10.3 mg/dL 8.1(L) 8.2(L) 8.5(L)  Total Protein 6.5 - 8.1 g/dL 6.1(L) - -  Total Bilirubin 0.3 - 1.2 mg/dL 0.9 - -  Alkaline Phos 38 - 126 U/L 64 - -  AST 15 - 41 U/L 16 - -  ALT 0 - 44 U/L 26 - -    Imaging studies: No new pertinent imaging studies   Assessment/Plan:  54 y.o. male 1 day postop from exploratory laparotomy, adhesiolysis, partial small bowel and colon resections and recreation of ileocolonic anastomosis secondary to a leak.  He has improved significantly over the past 12 hours.   -NPO for now.  I removed his NG tube and will only replace it if he endorses distention, nausea, or vomiting.  -Continue IV fluids  - pain control prn - monitor abdominal examination; on-going bowel function              - medical management of comorbid conditions; home medications; appreciate hospital medicine input and assistance. - Mobilization encouraged  -We will DC Foley and transfer to the floor. - DVT prophylaxis

## 2019-12-04 NOTE — Progress Notes (Signed)
*  PRELIMINARY RESULTS* Echocardiogram 2D Echocardiogram has been performed.  Garrel Ridgel Katheline Brendlinger 12/04/2019, 10:49 AM

## 2019-12-05 ENCOUNTER — Inpatient Hospital Stay: Payer: Self-pay

## 2019-12-05 LAB — COMPREHENSIVE METABOLIC PANEL
ALT: 22 U/L (ref 0–44)
AST: 11 U/L — ABNORMAL LOW (ref 15–41)
Albumin: 2.5 g/dL — ABNORMAL LOW (ref 3.5–5.0)
Alkaline Phosphatase: 63 U/L (ref 38–126)
Anion gap: 6 (ref 5–15)
BUN: 18 mg/dL (ref 6–20)
CO2: 22 mmol/L (ref 22–32)
Calcium: 8.2 mg/dL — ABNORMAL LOW (ref 8.9–10.3)
Chloride: 110 mmol/L (ref 98–111)
Creatinine, Ser: 0.86 mg/dL (ref 0.61–1.24)
GFR calc Af Amer: >60 mL/min (ref >60)
GFR calc non Af Amer: >60 mL/min (ref >60)
Glucose, Bld: 149 mg/dL — ABNORMAL HIGH (ref 70–99)
Potassium: 3.8 mmol/L (ref 3.5–5.1)
Sodium: 138 mmol/L (ref 135–145)
Total Bilirubin: 0.9 mg/dL (ref 0.3–1.2)
Total Protein: 6 g/dL — ABNORMAL LOW (ref 6.5–8.1)

## 2019-12-05 LAB — CBC
HCT: 29.6 % — ABNORMAL LOW (ref 39.0–52.0)
Hemoglobin: 9.6 g/dL — ABNORMAL LOW (ref 13.0–17.0)
MCH: 29.3 pg (ref 26.0–34.0)
MCHC: 32.4 g/dL (ref 30.0–36.0)
MCV: 90.2 fL (ref 80.0–100.0)
Platelets: 264 10*3/uL (ref 150–400)
RBC: 3.28 MIL/uL — ABNORMAL LOW (ref 4.22–5.81)
RDW: 13.8 % (ref 11.5–15.5)
WBC: 12.1 10*3/uL — ABNORMAL HIGH (ref 4.0–10.5)
nRBC: 0 % (ref 0.0–0.2)

## 2019-12-05 LAB — GLUCOSE, CAPILLARY
Glucose-Capillary: 112 mg/dL — ABNORMAL HIGH (ref 70–99)
Glucose-Capillary: 113 mg/dL — ABNORMAL HIGH (ref 70–99)
Glucose-Capillary: 126 mg/dL — ABNORMAL HIGH (ref 70–99)
Glucose-Capillary: 164 mg/dL — ABNORMAL HIGH (ref 70–99)
Glucose-Capillary: 94 mg/dL (ref 70–99)

## 2019-12-05 LAB — PHOSPHORUS: Phosphorus: 2.8 mg/dL (ref 2.5–4.6)

## 2019-12-05 LAB — MAGNESIUM: Magnesium: 2.2 mg/dL (ref 1.7–2.4)

## 2019-12-05 MED ORDER — FAT EMULSION PLANT BASED 20 % IV EMUL
250.0000 mL | INTRAVENOUS | Status: AC
  Administered 2019-12-05: 250 mL via INTRAVENOUS
  Filled 2019-12-05: qty 250

## 2019-12-05 MED ORDER — POTASSIUM CHLORIDE IN NACL 20-0.9 MEQ/L-% IV SOLN
INTRAVENOUS | Status: AC
  Filled 2019-12-05 (×2): qty 1000

## 2019-12-05 MED ORDER — INSULIN ASPART 100 UNIT/ML ~~LOC~~ SOLN
0.0000 [IU] | SUBCUTANEOUS | Status: DC
  Administered 2019-12-05 – 2019-12-06 (×2): 3 [IU] via SUBCUTANEOUS
  Administered 2019-12-06: 5 [IU] via SUBCUTANEOUS
  Administered 2019-12-06 (×2): 2 [IU] via SUBCUTANEOUS
  Administered 2019-12-06: 5 [IU] via SUBCUTANEOUS
  Administered 2019-12-07 (×2): 3 [IU] via SUBCUTANEOUS
  Administered 2019-12-07 (×3): 5 [IU] via SUBCUTANEOUS
  Administered 2019-12-07 – 2019-12-08 (×2): 3 [IU] via SUBCUTANEOUS
  Administered 2019-12-08: 2 [IU] via SUBCUTANEOUS
  Administered 2019-12-08: 5 [IU] via SUBCUTANEOUS
  Administered 2019-12-08 – 2019-12-09 (×5): 3 [IU] via SUBCUTANEOUS
  Administered 2019-12-09 (×2): 2 [IU] via SUBCUTANEOUS
  Filled 2019-12-05 (×22): qty 1

## 2019-12-05 MED ORDER — TRACE MINERALS CU-MN-SE-ZN 300-55-60-3000 MCG/ML IV SOLN
INTRAVENOUS | Status: AC
  Filled 2019-12-05: qty 960

## 2019-12-05 MED ORDER — KETOROLAC TROMETHAMINE 30 MG/ML IJ SOLN
30.0000 mg | Freq: Four times a day (QID) | INTRAMUSCULAR | Status: DC | PRN
  Administered 2019-12-05: 30 mg via INTRAVENOUS
  Filled 2019-12-05: qty 1

## 2019-12-05 MED ORDER — SODIUM CHLORIDE 0.9% FLUSH: 10.0000 mL | INTRAVENOUS | Status: DC | PRN

## 2019-12-05 NOTE — Progress Notes (Signed)
[facility] SURGICAL PROGRESS NOTE  Hospital Day(s): 6.   Post op day(s): 2 Days Post-Op.   Interval History:  Patient seen and examined no acute events or new complaints overnight.  Patient reports he is feeling better this morning He has incision soreness and mild distension.  No fever, chills, nausea or emesis, however he does burp a few times during interview Mild leukocytosis to 12.1K; afebrile Renal function normal, sCr - 0.86, UO - 950 ccs prior to foley removal No electrolyte derangement  Has been NPO since takeback last night, he did report an episode of flatus Mobilizing  Vital signs in last 24 hours: [min-max] current  Temp:  [98.1 F (36.7 C)-99.3 F (37.4 C)] 98.6 F (37 C) (05/03 0511) Pulse Rate:  [71-96] 71 (05/03 0511) Resp:  [15-23] 18 (05/03 0511) BP: (108-132)/(78-92) 132/91 (05/03 0511) SpO2:  [94 %-99 %] 99 % (05/03 0511)     Height: 5\' 3"  (160 cm) Weight: 72.6 kg BMI (Calculated): 28.35   Intake/Output last 2 shifts:  05/02 0701 - 05/03 0700 In: 2558.7 [I.V.:2137; IV Piggyback:421.8] Out: 950 [Urine:950]   Physical Exam:  Constitutional: alert, cooperative and no distress  Respiratory: breathing non-labored at rest  Cardiovascular: regular rate and sinus rhythm  Gastrointestinal:Soft,incisional soreness,he does appear mildly distended, no rebound/guarding Integumentary:Laparotomy and ostomy incisions are healing well, staples,penrose drains in ostomy sites, no erythema or drainage   Labs:  CBC Latest Ref Rng & Units 12/05/2019 12/04/2019 12/03/2019  WBC 4.0 - 10.5 K/uL 12.1(H) 13.1(H) 11.1(H)  Hemoglobin 13.0 - 17.0 g/dL 02/02/2020) 10.5(L) 10.3(L)  Hematocrit 39.0 - 52.0 % 29.6(L) 31.3(L) 31.4(L)  Platelets 150 - 400 K/uL 264 245 217   CMP Latest Ref Rng & Units 12/05/2019 12/04/2019 12/03/2019  Glucose 70 - 99 mg/dL 02/02/2020) 625(W) 389(H)  BUN 6 - 20 mg/dL 18 17 20   Creatinine 0.61 - 1.24 mg/dL 734(K 8.76)  Sodium 135 - 145  mmol/L 138 137 135  Potassium 3.5 - 5.1 mmol/L 3.8 4.0 3.7  Chloride 98 - 111 mmol/L 110 108 105  CO2 22 - 32 mmol/L 22 22 21(L)  Calcium 8.9 - 10.3 mg/dL 8.2(L) 8.1(L) 8.2(L)  Total Protein 6.5 - 8.1 g/dL 6.0(L) 6.1(L) -  Total Bilirubin 0.3 - 1.2 mg/dL 0.9 0.9 -  Alkaline Phos 38 - 126 U/L 63 64 -  AST 15 - 41 U/L 11(L) 16 -  ALT 0 - 44 U/L 22 26 -     Imaging studies: No new pertinent imaging studies   Assessment/Plan:  54 y.o. male doing well with questionable bowel fucntion 2 Days Post-Op s/p takeback exploratory laparotomy and repair of ileocolonic anastomosis for anastomotic leak following initial ileostomy and colostomy takedown on 04/27     - Will obtain KUB to reassess for improvement, if no improvement will initiate TPN   - NPO for now  - Continue IVF resuscitation  - Continue IV Abx   - Pain control prn (restart Toradol); antiemetics prn  - monitor abdominal examination; on-going bowel function    - encouraged mobilization  - medical management of comorbid conditions  - DVT prophylaxis    All of the above findings and recommendations were discussed with the patient, and the medical team, and all of patient's questions were answered to his expressed satisfaction.  -- 40, PA-C [facility] 12/05/2019, 7:58 AM [phone] M-F: 7am - 4pm

## 2019-12-05 NOTE — Progress Notes (Addendum)
Peripherally Inserted Central Catheter Placement  The IV Nurse has discussed with the patient and/or persons authorized to consent for the patient, the purpose of this procedure and the potential benefits and risks involved with this procedure.  The benefits include less needle sticks, lab draws from the catheter, and the patient may be discharged home with the catheter. Risks include, but not limited to, infection, bleeding, blood clot (thrombus formation), and puncture of an artery; nerve damage and irregular heartbeat and possibility to perform a PICC exchange if needed/ordered by physician.  Alternatives to this procedure were also discussed.  Bard Power PICC patient education guide, fact sheet on infection prevention and patient information card has been provided to patient /or left at bedside.    PICC Placement Documentation  PICC Double Lumen 12/05/19 PICC Right Basilic 37 cm 0 cm (Active)  Indication for Insertion or Continuance of Line Administration of hyperosmolar/irritating solutions (i.e. TPN, Vancomycin, etc.) 12/05/19 1154  Exposed Catheter (cm) 0 cm 12/05/19 1154  Site Assessment Clean;Dry;Intact 12/05/19 1154  Lumen #1 Status Flushed;Blood return noted 12/05/19 1154  Lumen #2 Status Flushed;Blood return noted;Saline locked 12/05/19 1154  Dressing Type Transparent 12/05/19 1154  Dressing Status Intact;Dry;Clean;Antimicrobial disc in place 12/05/19 1154  Dressing Change Due 12/12/19 12/05/19 1154   Consent signed with Spanish interpreter [phone], Kjersti Dittmer Ramos 12/05/2019, 11:57 AM

## 2019-12-05 NOTE — TOC Progression Note (Signed)
Transition of Care [facility]) - Progression Note    Patient Details  Name: Arris Meyn MRN: [id number] Date of Birth: [birth year]  [facility]) CM/SW Contact  Trenton Founds, RN Phone Number: 12/05/2019, 3:14 PM  Clinical Narrative:   Patient with PICC line placement today and will be starting TPN. Notified Pam with [facility] that patient may be potential candidate for home infusion. She will follow.     Expected Discharge Plan: Home/Self Care Barriers to Discharge: Continued Medical Work up  Expected Discharge Plan and Services Expected Discharge Plan: Home/Self Care   Discharge Planning Services: CM Consult   Living arrangements for the past 2 months: Mobile Home                                       Social Determinants of Health (SDOH) Interventions    Readmission Risk Interventions No flowsheet data found.

## 2019-12-05 NOTE — Consult Note (Signed)
PHARMACY - TOTAL PARENTERAL NUTRITION CONSULT NOTE   Indication: Prolonged ileus  Patient Measurements: Height: 5\' 3"  (160 cm) Weight: 72.6 kg (160 lb) IBW/kg (Calculated) : 56.9 TPN AdjBW (KG): 60.8 Body mass index is 28.34 kg/m.  Assessment: 54 y.o. 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  Pt now presents for ileostomy and colostomy takedown. Pt s/p Ileostomy takedown with ileocolonic anastomosis, colostomy takedown with colorectal anastomosis, splenic flexure mobilization, repair of left parastomal incisional hernia, repair of incisional umbilical hernia, rigid sigmoidoscopy, debridement of skin and subcutaneous tissue on 4/27   Pt s/p exploratory laparotomy, lysis of adhesions, resection of small bowel (just proximal andjustdistal to the prior anastomosis), resection of portion of colon 5/1 r/t anastomotic leak  Pt has been on NPO/liquid diet since admit and is now without adequate nutrition for 7 days. Pt with post op ileus and abdominal distension. KUB reports improving post-operative ileus.  Glucose / Insulin: mSSI q4h Electrolytes: WNL Renal: Scr 0.92-->1.43-->0.86 LFTs / TGs:  Prealbumin / albumin:  Intake / Output; MIVF:  GI Imaging: 5/3: Improved postoperative ileus Surgeries / Procedures:  4/27: Ileostomy takedown with ileocolonic anastomosis, colostomy takedown with colorectal anastomosis, splenic flexure mobilization, repair of left parastomal incisional hernia, repair of incisional umbilical hernia, rigid sigmoidoscopy, debridement of skin and subcutaneous tissue  5/1: exploratory laparotomy, lysis of adhesions, resection of small bowel (just proximal andjustdistal to the prior anastomosis), resection of portion of colon 5/1 r/t anastomotic leak  Central access: 12/05/19 TPN start date:  12/05/19  Nutritional Goals (per RD recommendation on 12/05/19): kCal: 1700-2000 kcal/day, Protein: 85-95g/day, Fluid: >1.7 L/day Goal TPN rate is 83 mL/hr   Current Nutrition:  NPO  Plan:   Start Clinimix 5/15 TPN with electrolytes at 40 mL/hr at 1800  Initiate 20% ILE at 6/15 daily over 12 hours  Electrolytes in TPN: 34mEq/L of Na, 60mEq/L of K, 53mEq/L of Ca, 2mEq/L of Mg, and 41mmol/L of Phos. Cl:Ac ratio 1:1  Add standard MVI, trace elements daily and thiamine 100 mg daily x 3 to TPN  continue moderate q4h SSI q4h and adjust as needed   Reduce MIVF (0.9% NaCl w/ 20 mEq KCl/L) to 60 mL/hr at 1800  Monitor TPN labs daily until stable then on Mon/Thurs once stable   12m 12/05/2019,10:15 AM

## 2019-12-05 NOTE — Progress Notes (Signed)
Nutrition Follow Up Note   DOCUMENTATION CODES:   Not applicable  INTERVENTION:    Initiate Clinimix 5/15 with electrolytes at 28m/hr (Goal rate 851mhr once labs stable)   Initiate 20% ILE at 25037maily over 12 hours   Add MVI and trace elements daily    Add IV thiamine 100m8mily x 3 days   Pt at high refeeding risk; monitor P, K, and Mg labs daily until stable   Daily weights   Keep total IVF + TPN rate at 100ml42m PA  NUTRITION DIAGNOSIS:   Increased nutrient needs related to post-op healing as evidenced by increased estimated needs.  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  Pt now presents for ileostomy and colostomy takedown. Pt s/p Ileostomy takedown with ileocolonic anastomosis, colostomy takedown with colorectal anastomosis, splenic flexure mobilization, repair of left parastomal incisional hernia, repair of incisional umbilical hernia, rigid sigmoidoscopy, debridement of skin and subcutaneous tissue for 20 cm x 2 cm on 4/27   Pt s/p exploratory laparotomy, lysis of adhesions, resection of small bowel (just proximal and just distal to the prior anastomosis), resection of portion of colon 5/1 r/t anastomotic leak  Pt has been on NPO/liquid diet since admit and is now without adequate nutrition for 7 days. Pt with post op ileus and abdominal distension. KUB reports improving post-operative ileus. Plan is for PICC line and TPN. Pt is having some flatus and loose stools. No new weight since admit; will request daily weights.   Medications reviewed and include: lovenox, insulin, protonix, NaCl w/ KCl '@100ml' /hr,  zosyn, vancomycin   Labs reviewed: K 3.8 wnl, P 2.8 wnl, Mg 2.2 wnl Wbc 12.1(H), Hgb 9.6(L), Hct 29.6(L) cbgs- 183, 166, 158, 130, 126 x 24 hrs  Diet Order:   Diet Order            Diet NPO time specified Except for: Sips with Meds, Ice Chips  Diet effective now             EDUCATION NEEDS:   No education needs have been identified at this time  Skin:  Skin Assessment: Reviewed RN Assessment(abdominal incision)  Last BM:  5/3- type 7  Height:   Ht Readings from Last 1 Encounters:  11/29/19 '5\' 3"'  (1.6 m)    Weight:   Wt Readings from Last 1 Encounters:  11/29/19 72.6 kg    Ideal Body Weight:  56.3 kg  BMI:  Body mass index is 28.34 kg/m.  Estimated Nutritional Needs:   Kcal:  1700-2000kcal/day  Protein:  85-95g/day  Fluid:  >1.7L/day  Alexander Carpenter DistanceRD, LDN Please refer to [facility] and/or RD on-call/weekend/after hours pager

## 2019-12-06 LAB — RENAL FUNCTION PANEL
Albumin: 2.5 g/dL — ABNORMAL LOW (ref 3.5–5.0)
Anion gap: 8 (ref 5–15)
BUN: 14 mg/dL (ref 6–20)
CO2: 24 mmol/L (ref 22–32)
Calcium: 7.9 mg/dL — ABNORMAL LOW (ref 8.9–10.3)
Chloride: 100 mmol/L (ref 98–111)
Creatinine, Ser: 0.65 mg/dL (ref 0.61–1.24)
GFR calc Af Amer: >60 mL/min (ref >60)
GFR calc non Af Amer: >60 mL/min (ref >60)
Glucose, Bld: 213 mg/dL — ABNORMAL HIGH (ref 70–99)
Phosphorus: 3.6 mg/dL (ref 2.5–4.6)
Potassium: 3.2 mmol/L — ABNORMAL LOW (ref 3.5–5.1)
Sodium: 132 mmol/L — ABNORMAL LOW (ref 135–145)

## 2019-12-06 LAB — SURGICAL PATHOLOGY

## 2019-12-06 LAB — GLUCOSE, CAPILLARY
Glucose-Capillary: 197 mg/dL — ABNORMAL HIGH (ref 70–99)
Glucose-Capillary: 205 mg/dL — ABNORMAL HIGH (ref 70–99)
Glucose-Capillary: 145 mg/dL — ABNORMAL HIGH (ref 70–99)
Glucose-Capillary: 136 mg/dL — ABNORMAL HIGH (ref 70–99)
Glucose-Capillary: 204 mg/dL — ABNORMAL HIGH (ref 70–99)

## 2019-12-06 LAB — MAGNESIUM: Magnesium: 2 mg/dL (ref 1.7–2.4)

## 2019-12-06 LAB — PREALBUMIN: Prealbumin: 10.5 mg/dL — ABNORMAL LOW (ref 18–38)

## 2019-12-06 MED ORDER — FAT EMULSION PLANT BASED 20 % IV EMUL
250.0000 mL | INTRAVENOUS | Status: AC
  Administered 2019-12-06: 250 mL via INTRAVENOUS
  Filled 2019-12-06: qty 250

## 2019-12-06 MED ORDER — POTASSIUM CHLORIDE CRYS ER 20 MEQ PO TBCR
40.0000 meq | EXTENDED_RELEASE_TABLET | Freq: Once | ORAL | Status: AC
  Administered 2019-12-06: 40 meq via ORAL
  Filled 2019-12-06: qty 2

## 2019-12-06 MED ORDER — POTASSIUM CHLORIDE IN NACL 20-0.9 MEQ/L-% IV SOLN
INTRAVENOUS | Status: DC
  Filled 2019-12-06: qty 1000

## 2019-12-06 MED ORDER — TRACE MINERALS CU-MN-SE-ZN 300-55-60-3000 MCG/ML IV SOLN
INTRAVENOUS | Status: AC
  Filled 2019-12-06: qty 1992

## 2019-12-06 NOTE — Progress Notes (Addendum)
[facility] SURGICAL PROGRESS NOTE  Hospital Day(s): 7.   Post op day(s): 3 Days Post-Op.   Interval History:  Patient seen and examined no acute events or new complaints overnight. Patient reports his abdominal pain is more controlled this morning He denied any fever, chills, nausea, or emesis. He is burping more frequently No fevers, ABx narrowed to Zosyn only 05/03 sCr - 0.65, good UP Mild hypokalemia TPN started yesterday He does have a BM recorded, and passed some flatus Mobilizing   Vital signs in last 24 hours: [min-max] current  Temp:  [97.7 F (36.5 C)-98.7 F (37.1 C)] 98.7 F (37.1 C) (05/04 0512) Pulse Rate:  [70-78] 78 (05/04 0512) Resp:  [18-20] 20 (05/04 0512) BP: (111-129)/(90-95) 128/95 (05/04 0512) SpO2:  [98 %-99 %] 99 % (05/04 0512) Weight:  [71.9 kg] 71.9 kg (05/04 0500)     Height: 5\' 3"  (160 cm) Weight: 71.9 kg BMI (Calculated): 28.09   Intake/Output last 2 shifts:  05/03 0701 - 05/04 0700 In: 1833.5 [I.V.:1619.7; IV Piggyback:213.8] Out: 425 [Urine:425]   Physical Exam:  Constitutional: alert, cooperative and no distress  Respiratory: breathing non-labored at rest  Cardiovascular: regular rate and sinus rhythm  Gastrointestinal:Soft,incisional soreness,he does appear mildly distended, no rebound/guarding Musculoskeletal: PICC to RUE Integumentary:Laparotomy and ostomy incisions are healing well, staples,penrose drains in ostomy sites, no erythema or drainage   Labs:  CBC Latest Ref Rng & Units 12/05/2019 12/04/2019 12/03/2019  WBC 4.0 - 10.5 K/uL 12.1(H) 13.1(H) 11.1(H)  Hemoglobin 13.0 - 17.0 g/dL 02/02/2020) 10.5(L) 10.3(L)  Hematocrit 39.0 - 52.0 % 29.6(L) 31.3(L) 31.4(L)  Platelets 150 - 400 K/uL 264 245 217   CMP Latest Ref Rng & Units 12/05/2019 12/04/2019 12/03/2019  Glucose 70 - 99 mg/dL 02/02/2020) 323(F) 573(U)  BUN 6 - 20 mg/dL 18 17 20   Creatinine 0.61 - 1.24 mg/dL 202(R 4.27)  Sodium 135 - 145 mmol/L 138 137 135   Potassium 3.5 - 5.1 mmol/L 3.8 4.0 3.7  Chloride 98 - 111 mmol/L 110 108 105  CO2 22 - 32 mmol/L 22 22 21(L)  Calcium 8.9 - 10.3 mg/dL 8.2(L) 8.1(L) 8.2(L)  Total Protein 6.5 - 8.1 g/dL 6.0(L) 6.1(L) -  Total Bilirubin 0.3 - 1.2 mg/dL 0.9 0.9 -  Alkaline Phos 38 - 126 U/L 63 64 -  AST 15 - 41 U/L 11(L) 16 -  ALT 0 - 44 U/L 22 26 -     Imaging studies: No new pertinent imaging studies   Assessment/Plan:  54 y.o. male overall improved this morning still with questionable return of bowel function 3 Days Post-Op s/p takeback exploratory laparotomy and repair of ileocolonic anastomosis for anastomotic leak following initial ileostomy and colostomy takedown on 04/27     - Continue TPN; monitor for re-feeding; he is mildly hypokalemic, replete and advance TPN to goal rate  - Morning KUB  - NPO for today  - Continue IVF resuscitation             - Continue IV Abx (Zosyn)             - Pain control prn; antiemetics prn             - monitor abdominal examination; on-going bowel function    - Discontinue cardiac monitoring             - encouraged mobilization             - medical management of comorbid conditions             -  DVT prophylaxis    All of the above findings and recommendations were discussed with the patient, and the medical team, and all of patient's questions were answered to his expressed satisfaction.  -- Edison Simon, PA-C [facility] 12/06/2019, 7:16 AM [phone] M-F: 7am - 4pm

## 2019-12-06 NOTE — Progress Notes (Signed)
Inpatient Diabetes Program Recommendations  AACE/ADA: New Consensus Statement on Inpatient Glycemic Control   Target Ranges:  Prepandial:   less than 140 mg/dL      Peak postprandial:   less than 180 mg/dL (1-2 hours)      Critically ill patients:  140 - 180 mg/dL   Results for RAHKEEM, SENFT (MRN [id number]) as of 12/06/2019 10:32  Ref. Range 12/05/2019 07:32 12/05/2019 12:08 12/05/2019 16:25 12/05/2019 19:45 12/05/2019 23:42 12/06/2019 05:15 12/06/2019 08:13  Glucose-Capillary Latest Ref Range: 70 - 99 mg/dL 931 (H) 121 (H) 94 624 (H) 164 (H) 197 (H) 205 (H)   Review of Glycemic Control  Diabetes history: DM2 Outpatient Diabetes medications: Januvia 50 mg daily Current orders for Inpatient glycemic control: Novolog 0-15 units Q4H  Inpatient Diabetes Program Recommendations:   Insulin: TPN started on 12/05/19 and glucose more elevated since starting. TPN currently at 40 ml/hr and will be increased to 83 ml/hr starting today at 18:00. Recommend adding Regular 30 units to TPN that will be started today at 18:00.   Thanks, Orlando Penner, RN, MSN, CDE Diabetes Coordinator Inpatient Diabetes Program [phone] (Team Pager from 8am to 5pm)

## 2019-12-06 NOTE — Consult Note (Signed)
PHARMACY - TOTAL PARENTERAL NUTRITION CONSULT NOTE   Indication: Prolonged ileus  Patient Measurements: Height: 5\' 3"  (160 cm) Weight: 71.9 kg (158 lb 8.2 oz) IBW/kg (Calculated) : 56.9 TPN AdjBW (KG): 60.8 Body mass index is 28.08 kg/m.  Assessment: 54 y.o. 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  Pt now presents for ileostomy and colostomy takedown. Pt s/p Ileostomy takedown with ileocolonic anastomosis, colostomy takedown with colorectal anastomosis, splenic flexure mobilization, repair of left parastomal incisional hernia, repair of incisional umbilical hernia, rigid sigmoidoscopy, debridement of skin and subcutaneous tissue on 4/27   Pt s/p exploratory laparotomy, lysis of adhesions, resection of small bowel (just proximal andjustdistal to the prior anastomosis), resection of portion of colon 5/1 r/t anastomotic leak  Pt has been on NPO/liquid diet since admit and is now without adequate nutrition for 7 days prior to TPN start. Pt with post op ileus and abdominal distension. KUB reports improving post-operative ileus.  Glucose / Insulin: mSSI q4h Electrolytes: K 3.2 replaced with an additional 40 mEq oral KCl Renal: Scr 0.92-->1.43-->0.65 LFTs / TGs: TG 139, LFTs stable Prealbumin / albumin: albumin 2.5 g/dL Intake / Output; MIVF: 0.9% NaCl w/ 20 mEq KCl/L  GI Imaging: 5/3: Improved postoperative ileus Surgeries / Procedures:  4/27: Ileostomy takedown with ileocolonic anastomosis, colostomy takedown with colorectal anastomosis, splenic flexure mobilization, repair of left parastomal incisional hernia, repair of incisional umbilical hernia, rigid sigmoidoscopy, debridement of skin and subcutaneous tissue  5/1: exploratory laparotomy, lysis of adhesions, resection of small bowel (just proximal andjustdistal to the  prior anastomosis), resection of portion of colon 5/1 r/t anastomotic leak  Central access: 12/05/19 TPN start date: 12/05/19  Nutritional Goals (per RD recommendation on 12/05/19): kCal: 1700-2000 kcal/day, Protein: 85-95g/day, Fluid: >1.7 L/day Goal TPN rate is 83 mL/hr   Current Nutrition:  NPO  Plan:   Advance Clinimix 5/15 TPN with electrolytes to 83 mL/hr   continue 20% ILE at 6/15 daily over 12 hours  Electrolytes in TPN: 65mEq/L of Na, 95mEq/L of K, 51mEq/L of Ca, 27mEq/L of Mg, and 55mmol/L of Phos. Cl:Ac ratio 1:1  Add standard MVI, trace elements daily and thiamine 100 mg daily x 3 to TPN (this is day #2/3)  Add 30 units regular insulin to TPN  continue moderate q4h SSI q4h and adjust as needed   Reduce MIVF (0.9% NaCl w/ 20 mEq KCl/L) to 15 mL/hr at 1800  Monitor TPN labs daily until stable then on Mon/Thurs once stable   12m 12/06/2019,7:04 AM

## 2019-12-06 NOTE — Plan of Care (Signed)
Patient is NPO with exception of ice chips and sips with meds due to an ileus. Currently on TPN/Fats. Will continue to monitor. Alexander Carpenter

## 2019-12-07 ENCOUNTER — Inpatient Hospital Stay: Payer: Self-pay

## 2019-12-07 LAB — CULTURE, BLOOD (ROUTINE X 2)
Culture: NO GROWTH
Culture: NO GROWTH
Special Requests: ADEQUATE
Special Requests: ADEQUATE

## 2019-12-07 LAB — GLUCOSE, CAPILLARY
Glucose-Capillary: 129 mg/dL — ABNORMAL HIGH (ref 70–99)
Glucose-Capillary: 153 mg/dL — ABNORMAL HIGH (ref 70–99)
Glucose-Capillary: 168 mg/dL — ABNORMAL HIGH (ref 70–99)
Glucose-Capillary: 175 mg/dL — ABNORMAL HIGH (ref 70–99)
Glucose-Capillary: 202 mg/dL — ABNORMAL HIGH (ref 70–99)
Glucose-Capillary: 206 mg/dL — ABNORMAL HIGH (ref 70–99)
Glucose-Capillary: 214 mg/dL — ABNORMAL HIGH (ref 70–99)

## 2019-12-07 LAB — RENAL FUNCTION PANEL
Albumin: 2.5 g/dL — ABNORMAL LOW (ref 3.5–5.0)
Anion gap: 8 (ref 5–15)
BUN: 13 mg/dL (ref 6–20)
CO2: 26 mmol/L (ref 22–32)
Calcium: 8.1 mg/dL — ABNORMAL LOW (ref 8.9–10.3)
Chloride: 101 mmol/L (ref 98–111)
Creatinine, Ser: 0.68 mg/dL (ref 0.61–1.24)
GFR calc Af Amer: >60 mL/min (ref >60)
GFR calc non Af Amer: >60 mL/min (ref >60)
Glucose, Bld: 222 mg/dL — ABNORMAL HIGH (ref 70–99)
Phosphorus: 3.5 mg/dL (ref 2.5–4.6)
Potassium: 3.3 mmol/L — ABNORMAL LOW (ref 3.5–5.1)
Sodium: 135 mmol/L (ref 135–145)

## 2019-12-07 LAB — MAGNESIUM: Magnesium: 2.3 mg/dL (ref 1.7–2.4)

## 2019-12-07 MED ORDER — FAT EMULSION PLANT BASED 20 % IV EMUL
250.0000 mL | INTRAVENOUS | Status: AC
  Administered 2019-12-07: 250 mL via INTRAVENOUS
  Filled 2019-12-07: qty 250

## 2019-12-07 MED ORDER — TRACE MINERALS CU-MN-SE-ZN 300-55-60-3000 MCG/ML IV SOLN
INTRAVENOUS | Status: DC
  Filled 2019-12-07: qty 1992

## 2019-12-07 MED ORDER — TRACE MINERALS CU-MN-SE-ZN 300-55-60-3000 MCG/ML IV SOLN
INTRAVENOUS | Status: AC
  Filled 2019-12-07: qty 1996

## 2019-12-07 MED ORDER — POTASSIUM CHLORIDE CRYS ER 20 MEQ PO TBCR
40.0000 meq | EXTENDED_RELEASE_TABLET | Freq: Two times a day (BID) | ORAL | Status: AC
  Administered 2019-12-07 (×2): 40 meq via ORAL
  Filled 2019-12-07 (×2): qty 2

## 2019-12-07 NOTE — Progress Notes (Signed)
[facility] SUR[facility]ICAL PROGRESS NOTE  Hospital Day(s): 8.   Post op day(s): 4 Days Post-Op.   Interval History:  Patient seen and examined no acute events or new complaints overnight.  Patient reports his abdominal pain is improving No fever, chills, nausea, or emesis Still with some hypokalemia to 3.3 Renal function baseline and normal, good UO Has remained NPO on TPN He continues to endorse bowel function Mobilizing  Vital signs in last 24 hours: [min-max] current  Temp:  [97.5 F (36.4 C)-98.7 F (37.1 C)] 98.7 F (37.1 C) (05/04 2021) Pulse Rate:  [78-81] 78 (05/04 2021) Resp:  [18] 18 (05/04 2021) BP: (107-120)/(72-93) 107/72 (05/04 2021) SpO2:  [98 %] 98 % (05/04 2021) Weight:  [70.5 kg] 70.5 kg (05/05 0500)     Height: 5\' 3"  (160 cm) Weight: 70.5 kg BMI (Calculated): 27.54   Intake/Output last 2 shifts:  05/04 0701 - 05/05 0700 In: 2582.5 [I.V.:2394.8; IV Piggyback:187.7] Out: -    Physical Exam:  Constitutional: alert, cooperative and no distress  Respiratory: breathing non-labored at rest  Cardiovascular: regular rate and sinus rhythm  Gastrointestinal:Soft,incisional soreness,he does appear mildly distended, no rebound/guarding Musculoskeletal: PICC to RUE Integumentary:Laparotomy and ostomy incisions are healing well, staples,penrose drains in ostomy sites, no erythema or drainage    Labs:  CBC Latest Ref Rng & Units 12/05/2019 12/04/2019 12/03/2019  WBC 4.0 - 10.5 K/uL 12.1(H) 13.1(H) 11.1(H)  Hemoglobin 13.0 - 17.0 g/dL 02/02/2020) 10.5(L) 10.3(L)  Hematocrit 39.0 - 52.0 % 29.6(L) 31.3(L) 31.4(L)  Platelets 150 - 400 K/uL 264 245 217   CMP Latest Ref Rng & Units 12/07/2019 12/06/2019 12/05/2019  Glucose 70 - 99 mg/dL 02/04/2020) 295(M) 841(L)  BUN 6 - 20 mg/dL 13 14 18   Creatinine 0.61 - 1.24 mg/dL 244(W 1.02  Sodium 135 - 145 mmol/L 135 132(L) 138  Potassium 3.5 - 5.1 mmol/L 3.3(L) 3.2(L) 3.8  Chloride 98 - 111 mmol/L 101 100 110  CO2 22 - 32  mmol/L 26 24 22   Calcium 8.9 - 10.3 mg/dL 8.1(L) 7.9(L) 8.2(L)  Total Protein 6.5 - 8.1 g/dL - - 6.0(L)  Total Bilirubin 0.3 - 1.2 mg/dL - - 0.9  Alkaline Phos 38 - 126 U/L - - 63  AST 15 - 41 U/L - - 11(L)  ALT 0 - 44 U/L - - 22     Imaging studies:   KUB (12/07/2019) personally reviewed showing still multiple distended loops of bowel in the lower abdomen with scarce amount of gas in colon, and radiologist report pending   Assessment/Plan:  54 y.o. male with reports of continued bowel function 4 Days Post-Op s/p takeback exploratory laparotomy and repair of ileocolonic anastomosis for anastomotic leak following initialileostomy and colostomy takedownon 04/27   - Continue TPN; monitor for re-feeding; he is mildly hypokalemic, replete and continue goal rate TPN  - We will trial on CLD today             - Continue IVF resuscitation - Continue IV Abx(Zosyn) - Pain control prn; antiemetics prn - monitor abdominal examination; on-going bowel function              - Discontinue cardiac monitoring - encouraged mobilization - medical management of comorbid conditions - DVT prophylaxis   All of the above findings and recommendations were discussed with the patient, and the medical team, and all of patient's questions were answered to his expressed satisfaction.  -- 02/06/2020, PA-C [facility] 12/07/2019, 7:25 AM [phone] M-F:  7am - 4pm

## 2019-12-07 NOTE — Progress Notes (Signed)
Inpatient Diabetes Program Recommendations  AACE/ADA: New Consensus Statement on Inpatient Glycemic Control   Target Ranges:  Prepandial:   less than 140 mg/dL      Peak postprandial:   less than 180 mg/dL (1-2 hours)      Critically ill patients:  140 - 180 mg/dL   Results for FERNIE, GRIMM (MRN [id number]) as of 12/07/2019 07:28  Ref. Range 12/06/2019 05:15 12/06/2019 08:13 12/06/2019 11:46 12/06/2019 16:52 12/06/2019 20:16 12/07/2019 00:03 12/07/2019 04:26  Glucose-Capillary Latest Ref Range: 70 - 99 mg/dL 172 (H) 091 (H) 068 (H) 136 (H) 204 (H) 175 (H) 206 (H)   Review of Glycemic Control  Diabetes history: DM2 Outpatient Diabetes medications: Januvia 50 mg daily Current orders for Inpatient glycemic control: Novolog 0-15 units Q4H; TPN @ 83 ml/hr with Regular 30 units  Inpatient Diabetes Program Recommendations:   Insulin: Please consider increasing insulin in TPN to Regular 40 units that will be started today at 18:00.  Thanks, Orlando Penner, RN, MSN, CDE Diabetes Coordinator Inpatient Diabetes Program [phone] (Team Pager from 8am to 5pm)

## 2019-12-07 NOTE — Consult Note (Addendum)
PHARMACY - TOTAL PARENTERAL NUTRITION CONSULT NOTE   Indication: Prolonged ileus  Patient Measurements: Height: 5\' 3"  (160 cm) Weight: 70.5 kg (155 lb 6.8 oz) IBW/kg (Calculated) : 56.9 TPN AdjBW (KG): 60.8 Body mass index is 27.53 kg/m.  Assessment: 54 y.o. 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  Pt now presents for ileostomy and colostomy takedown. Pt s/p Ileostomy takedown with ileocolonic anastomosis, colostomy takedown with colorectal anastomosis, splenic flexure mobilization, repair of left parastomal incisional hernia, repair of incisional umbilical hernia, rigid sigmoidoscopy, debridement of skin and subcutaneous tissue on 4/27   Pt s/p exploratory laparotomy, lysis of adhesions, resection of small bowel (just proximal andjustdistal to the prior anastomosis), resection of portion of colon 5/1 r/t anastomotic leak  Pt has been on NPO/liquid diet since admit and is now without adequate nutrition for 7 days prior to TPN start. Pt with post op ileus and abdominal distension. KUB reports improving post-operative ileus.  Glucose / Insulin: mSSI q4h Electrolytes: K 3.3 replaced with an additional 40 mEq oral KCl x 2 Renal: Scr 0.92-->1.43-->0.65 LFTs / TGs: TG 139, LFTs stable Prealbumin / albumin: albumin 2.5 g/dL Intake / Output; MIVF: 0.9% NaCl w/ 20 mEq KCl/L  GI Imaging: 5/3: Improved postoperative ileus Surgeries / Procedures:  4/27: Ileostomy takedown with ileocolonic anastomosis, colostomy takedown with colorectal anastomosis, splenic flexure mobilization, repair of left parastomal incisional hernia, repair of incisional umbilical hernia, rigid sigmoidoscopy, debridement of skin and subcutaneous tissue  5/1: exploratory laparotomy, lysis of adhesions, resection of small bowel (just proximal andjustdistal to  the prior anastomosis), resection of portion of colon 5/1 r/t anastomotic leak  Central access: 12/05/19 TPN start date: 12/05/19  Nutritional Goals (per RD recommendation on 12/05/19): kCal: 1700-2000 kcal/day, Protein: 85-95g/day, Fluid: >1.7 L/day Goal TPN rate is 83 mL/hr   Current Nutrition:  NPO  Plan:   Start Clinimix 5/15 TPN without electrolytes (currently out of E 5/15) at 83 mL/hr   continue 20% ILE at 6/15 daily over 12 hours  Electrolytes in TPN: 52mEq/L of Na, 50mEq/L of K, 59mEq/L of Ca, 72mEq/L of Mg, and 31mmol/L of Phos. Cl:Ac ratio 1:1  Add standard MVI, trace elements daily and thiamine 100 mg daily x 3 to TPN (this is day #3/3)  Add 40 units regular insulin to TPN (increased from 30 units 5/4)  continue moderate q4h SSI q4h and adjust as needed   Continue MIVF (0.9% NaCl w/ 20 mEq KCl/L) to 15 mL/hr at 1800  Monitor TPN labs daily until stable then on Mon/Thurs once stable   12m 12/07/2019,7:23 AM

## 2019-12-08 LAB — GLUCOSE, CAPILLARY
Glucose-Capillary: 197 mg/dL — ABNORMAL HIGH (ref 70–99)
Glucose-Capillary: 160 mg/dL — ABNORMAL HIGH (ref 70–99)
Glucose-Capillary: 167 mg/dL — ABNORMAL HIGH (ref 70–99)
Glucose-Capillary: 218 mg/dL — ABNORMAL HIGH (ref 70–99)
Glucose-Capillary: 198 mg/dL — ABNORMAL HIGH (ref 70–99)

## 2019-12-08 LAB — COMPREHENSIVE METABOLIC PANEL
ALT: 25 U/L (ref 0–44)
AST: 15 U/L (ref 15–41)
Albumin: 2.6 g/dL — ABNORMAL LOW (ref 3.5–5.0)
Alkaline Phosphatase: 91 U/L (ref 38–126)
Anion gap: 6 (ref 5–15)
BUN: 14 mg/dL (ref 6–20)
CO2: 27 mmol/L (ref 22–32)
Calcium: 8.2 mg/dL — ABNORMAL LOW (ref 8.9–10.3)
Chloride: 101 mmol/L (ref 98–111)
Creatinine, Ser: 0.9 mg/dL (ref 0.61–1.24)
GFR calc Af Amer: >60 mL/min (ref >60)
GFR calc non Af Amer: >60 mL/min (ref >60)
Glucose, Bld: 191 mg/dL — ABNORMAL HIGH (ref 70–99)
Potassium: 3.9 mmol/L (ref 3.5–5.1)
Sodium: 134 mmol/L — ABNORMAL LOW (ref 135–145)
Total Bilirubin: 0.4 mg/dL (ref 0.3–1.2)
Total Protein: 6.8 g/dL (ref 6.5–8.1)

## 2019-12-08 LAB — MAGNESIUM: Magnesium: 2.2 mg/dL (ref 1.7–2.4)

## 2019-12-08 MED ORDER — ENSURE ENLIVE PO LIQD
237.0000 mL | Freq: Two times a day (BID) | ORAL | Status: DC
  Administered 2019-12-08 (×2): 237 mL via ORAL

## 2019-12-08 MED ORDER — TRACE MINERALS CU-MN-SE-ZN 300-55-60-3000 MCG/ML IV SOLN
INTRAVENOUS | Status: DC
  Filled 2019-12-08: qty 960

## 2019-12-08 MED ORDER — TRACE MINERALS CU-MN-SE-ZN 300-55-60-3000 MCG/ML IV SOLN: INTRAVENOUS | Status: DC

## 2019-12-08 MED ORDER — FAT EMULSION PLANT BASED 20 % IV EMUL
250.0000 mL | INTRAVENOUS | Status: AC
  Administered 2019-12-08: 250 mL via INTRAVENOUS
  Filled 2019-12-08: qty 250

## 2019-12-08 NOTE — Consult Note (Signed)
PHARMACY - TOTAL PARENTERAL NUTRITION CONSULT NOTE   Indication: Prolonged ileus  Patient Measurements: Height: 5\' 3"  (160 cm) Weight: 70.4 kg (155 lb 3.3 oz) IBW/kg (Calculated) : 56.9 TPN AdjBW (KG): 60.8 Body mass index is 27.49 kg/m.  Assessment: 54 y.o. 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  Pt now presents for ileostomy and colostomy takedown. Pt s/p Ileostomy takedown with ileocolonic anastomosis, colostomy takedown with colorectal anastomosis, splenic flexure mobilization, repair of left parastomal incisional hernia, repair of incisional umbilical hernia, rigid sigmoidoscopy, debridement of skin and subcutaneous tissue on 4/27   Pt s/p exploratory laparotomy, lysis of adhesions, resection of small bowel (just proximal andjustdistal to the prior anastomosis), resection of portion of colon 5/1 r/t anastomotic leak  Pt has been on NPO/liquid diet since admit and is now without adequate nutrition for 7 days prior to TPN start. Pt with post op ileus and abdominal distension. KUB reports improving post-operative ileus.  Glucose / Insulin: mSSI q4h Electrolytes: K 3.3 replaced with an additional 40 mEq oral KCl x 2 Renal: Scr 0.92-->1.43-->0.9 LFTs / TGs: TG 139, LFTs stable Prealbumin / albumin: albumin 2.5 g/dL Intake / Output; MIVF: has been stopped, last I/O: +2,660.40ml GI Imaging: 5/3: Improved postoperative ileus Surgeries / Procedures:  4/27: Ileostomy takedown with ileocolonic anastomosis, colostomy takedown with colorectal anastomosis, splenic flexure mobilization, repair of left parastomal incisional hernia, repair of incisional umbilical hernia, rigid sigmoidoscopy, debridement of skin and subcutaneous tissue  5/1: exploratory laparotomy, lysis of adhesions, resection of small bowel (just proximal  andjustdistal to the prior anastomosis), resection of portion of colon 5/1 r/t anastomotic leak  Central access: 12/05/19 TPN start date: 12/05/19  Nutritional Goals (per RD recommendation on 12/05/19): kCal: 1700-2000 kcal/day, Protein: 85-95g/day, Fluid: >1.7 L/day Goal TPN rate is 83 mL/hr   Current Nutrition:  NPO  Plan:   Will wean down Clinimix 5/15 TPN without electrolytes (currently out of E 5/15) to 40 mL/hr   continue 20% ILE at 6/15 daily over 12 hours  Electrolytes in TPN: 62mEq/L of Na, 22mEq/L of K, 72mEq/L of Ca, 24mEq/L of Mg, and 76mmol/L of Phos. Cl:Ac ratio 1:1  Add standard MVI, trace elements daily   Add 20 units regular insulin to TPN (decreased from 40 units 5/5 d/t lowered rate)  continue moderate q4h SSI q4h and adjust as needed   MIVF has been discontinued  Monitor TPN labs daily until stable then on Mon/Thurs once stable   Caysie Minnifield A Louna Rothgeb 12/08/2019,1:16 PM

## 2019-12-08 NOTE — Progress Notes (Signed)
[facility] SUR[facility]ICAL PROGRESS NOTE  Hospital Day(s): 9.   Post op day(s): 5 Days Post-Op.   Interval History:  Patient seen and examined no acute events or new complaints overnight.  Patient reports he is feeling better this morning Abdominal pain has improved No fever, chills, nausea, or emesis. He is belching less Labs are reassuring He has tolerated CLD Continues to have bowel function, more formed stools Mobilizing  Vital signs in last 24 hours: [min-max] current  Temp:  [98 F (36.7 C)-98.6 F (37 C)] 98.3 F (36.8 C) (05/06 0458) Pulse Rate:  [76-82] 81 (05/06 0458) Resp:  [16-20] 20 (05/06 0458) BP: (114-123)/(84-86) 123/84 (05/06 0458) SpO2:  [97 %-98 %] 98 % (05/06 0458) Weight:  [70.4 kg] 70.4 kg (05/06 0500)     Height: 5\' 3"  (160 cm) Weight: 70.4 kg BMI (Calculated): 27.5   Intake/Output last 2 shifts:  05/05 0701 - 05/06 0700 In: 3990 [P.O.:1530; I.V.:2252.8; IV Piggyback:207.3] Out: -    Physical Exam:  Constitutional: alert, cooperative and no distress  Respiratory: breathing non-labored at rest  Cardiovascular: regular rate and sinus rhythm  Gastrointestinal:Soft,incisional soreness,abdomen is much less distended this morning, no rebound/guarding Musculoskeletal: PICC toRUE Integumentary:Laparotomy and ostomy incisions are healing well, staples,penrose drains in ostomy sites, no erythema or drainage  Labs:  CBC Latest Ref Rng & Units 12/05/2019 12/04/2019 12/03/2019  WBC 4.0 - 10.5 K/uL 12.1(H) 13.1(H) 11.1(H)  Hemoglobin 13.0 - 17.0 g/dL 02/02/2020) 10.5(L) 10.3(L)  Hematocrit 39.0 - 52.0 % 29.6(L) 31.3(L) 31.4(L)  Platelets 150 - 400 K/uL 264 245 217   CMP Latest Ref Rng & Units 12/08/2019 12/07/2019 12/06/2019  Glucose 70 - 99 mg/dL 02/05/2020) 194(R) 740(C)  BUN 6 - 20 mg/dL 14 13 14   Creatinine 0.61 - 1.24 mg/dL 144(Y 1.85  Sodium 135 - 145 mmol/L 134(L) 135 132(L)  Potassium 3.5 - 5.1 mmol/L 3.9 3.3(L) 3.2(L)  Chloride 98 - 111 mmol/L  101 101 100  CO2 22 - 32 mmol/L 27 26 24   Calcium 8.9 - 10.3 mg/dL 8.2(L) 8.1(L) 7.9(L)  Total Protein 6.5 - 8.1 g/dL 6.8 - -  Total Bilirubin 0.3 - 1.2 mg/dL 0.4 - -  Alkaline Phos 38 - 126 U/L 91 - -  AST 15 - 41 U/L 15 - -  ALT 0 - 44 U/L 25 - -     Imaging studies: No new pertinent imaging studies   Assessment/Plan:  54 y.o. male with more significant return of bowel function and improvement in distension 5 Days Post-Op s/p takeback exploratory laparotomy and repair of ileocolonic anastomosis for anastomotic leak following initialileostomy and colostomy takedownon 04/27   - Wean TPN to 1/2 rate today; anticipate d/c this tomorrow             - Advance to full liquids this morning; if doing well this PM then okay for soft deit - D/C IVF resuscitation - Continue IV Abx(Zosyn) - Pain control prn; antiemetics prn - monitor abdominal examination; on-going bowel function - encouraged mobilization - medical management of comorbid conditions - DVT prophylaxis   - Discharge Planning: If doing well and tolerates advancement of diet today then hopefully home in next 24-48 hours  All of the above findings and recommendations were discussed with the patient, and the medical team, and all of patient's questions were answered to his expressed satisfaction.  -- , PA-C  [facility] 12/08/2019, 7:36 AM [phone] M-F: 7am - 4pm

## 2019-12-08 NOTE — Progress Notes (Signed)
Nutrition Follow Up Note   DOCUMENTATION CODES:   Not applicable  INTERVENTION:    Decrease Clinimix 5/15 to 32m/hr    Continue 20% ILE at 2518mdaily over 12 hours   Continue MVI and trace elements daily   Ensure Enlive po BID, each supplement provides 350 kcal and 20 grams of protein  NUTRITION DIAGNOSIS:   Increased nutrient needs related to post-op healing as evidenced by increased estimated needs.  GOAL:   Patient will meet greater than or equal to 90% of their needs  -met with TPN   MONITOR:   PO intake, Supplement acceptance, Labs, Weight trends, Skin, I & O's, TPN  ASSESSMENT:   [age].o. 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  Pt now presents for ileostomy and colostomy takedown. Pt s/p Ileostomy takedown with ileocolonic anastomosis, colostomy takedown with colorectal anastomosis, splenic flexure mobilization, repair of left parastomal incisional hernia, repair of incisional umbilical hernia, rigid sigmoidoscopy, debridement of skin and subcutaneous tissue for 20 cm x 2 cm on 4/27   Pt s/p exploratory laparotomy, lysis of adhesions, resection of small bowel (just proximal and just distal to the prior anastomosis), resection of portion of colon 5/1 r/t anastomotic leak  Pt tolerating TPN well at goal rate. Pt advanced to clear liquids yesterday and tolerated well. Plan is for full liquids today, RD will add Ensure supplements. Plan is to wean TPN today and hopefully discontinue tomorrow. Refeed labs stable. Per chart, pt is down ~5lbs since admit.   Medications reviewed and include: lovenox, insulin, protonix, zosyn  Labs reviewed: Na 134(L), P 3. Wnl, Mg 2.2 wnl Pre-albumin 10.5(L)- 5/4 Triglycerides - 139- 5/1 Wbc- 12.1(H), Hgb 9.6(L), Hct 29.6(L) cbgs- 197, 160 x 24 hrs  Diet Order:   Diet  Order            Diet full liquid Room service appropriate? Yes; Fluid consistency: Thin  Diet effective now             EDUCATION NEEDS:   No education needs have been identified at this time  Skin:  Skin Assessment: Reviewed RN Assessment(abdominal incision)  Last BM:  5/5- type 7  Height:   Ht Readings from Last 1 Encounters:  11/29/19 '5\' 3"'  (1.6 m)    Weight:   Wt Readings from Last 1 Encounters:  12/08/19 70.4 kg    Ideal Body Weight:  56.3 kg  BMI:  Body mass index is 27.49 kg/m.  Estimated Nutritional Needs:   Kcal:  1700-2000kcal/day  Protein:  85-95g/day  Fluid:  >1.7L/day  CaKoleen DistanceS, RD, LDN Please refer to [facility]or RD and/or RD on-call/weekend/after hours pager

## 2019-12-09 LAB — GLUCOSE, CAPILLARY
Glucose-Capillary: 143 mg/dL — ABNORMAL HIGH (ref 70–99)
Glucose-Capillary: 162 mg/dL — ABNORMAL HIGH (ref 70–99)
Glucose-Capillary: 163 mg/dL — ABNORMAL HIGH (ref 70–99)
Glucose-Capillary: 178 mg/dL — ABNORMAL HIGH (ref 70–99)

## 2019-12-09 MED ORDER — LOPERAMIDE HCL 2 MG PO TABS
2.0000 mg | ORAL_TABLET | ORAL | 0 refills | Status: AC | PRN
Start: 2019-12-09 — End: ?

## 2019-12-09 MED ORDER — METAMUCIL SMOOTH TEXTURE 58.6 % PO POWD: 1.0000 | Freq: Every day | ORAL | 12 refills | Status: AC

## 2019-12-09 MED ORDER — OXYCODONE HCL 5 MG PO TABS
5.0000 mg | ORAL_TABLET | Freq: Four times a day (QID) | ORAL | 0 refills | Status: DC | PRN
Start: 2019-12-09 — End: 2020-01-11

## 2019-12-09 MED ORDER — IBUPROFEN 800 MG PO TABS
800.0000 mg | ORAL_TABLET | Freq: Three times a day (TID) | ORAL | 0 refills | Status: DC | PRN
Start: 2019-12-09 — End: 2020-01-11

## 2019-12-09 NOTE — Discharge Summary (Signed)
[facility] SUR[facility]ICAL DISCHARGE SUMMARY   Patient ID: Alexander Carpenter MRN: [id number] DOB/AGE: 1966-05-11 54 y.o.  Admit date: 11/29/2019 Discharge date: 12/09/2019  Discharge Diagnoses Patient Active Problem List   Diagnosis Date Noted  . Ileocolic anastomotic leak   . Ileostomy in place [facility]) 11/29/2019  . Colostomy in place [facility])   . Diverticulosis of large intestine without diverticulitis   . Colonic edema   . Diverticulitis of sigmoid colon 07/07/2018  . Large bowel obstruction (HCC) 07/07/2018  . Cecum perforation 07/07/2018  . Pneumoperitoneum     Consultants None  Procedures 11/29/2019:  Ileostomy takedown with ileocolonic anastomosis, colostomy takedown with colorectal anastomosis, splenic flexure mobilization, repair of left parastomal incisional hernia, repair of incisional umbilical hernia, rigid sigmoidoscopy, debridement of skin and subcutaneous tissue for 20 cm x 2 cm.  12/03/2019: Exploratory laparotomy, lysis of adhesions, resection of small bowel, resection of portion of colon   HPI: Alexander Carpenter is a 54 y.o. 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  Post-operatively, patient had initially done well and diet was advancing. However, on POD3-4 he began to deteriorate and developed worsening abdominal pain and hemodynamic instability. He returned to the OR. He underwent uneventful exploratory laparotomy and repair of ileocolonic anastomotic leak (Dr Lady Gary, 12/03/2019). Post-operatively he developed a post-surgical ileus and did require TPN for a few days. Ultimately,  bowel function returned and advancement of patient's diet and ambulation were well-tolerated. The remainder of patient's hospital course was essentially unremarkable, and discharge planning was initiated accordingly with patient safely able to be discharged home with appropriate discharge instructions, pain control, and outpatient follow-up after all of his questions were answered to his expressed satisfaction.   Discharge Condition: Good   Physical Examination:  Constitutional: alert, cooperative and no distress  Respiratory: breathing non-labored at rest  Cardiovascular: regular rate and sinus rhythm  Gastrointestinal:Soft,incisional soreness,abdomen is much less distended this morning, no rebound/guarding Musculoskeletal: PICC toRUE Integumentary:Laparotomy and ostomy incisions are healing well, staples,penrose drains in ostomy sites, no erythema or drainage    Allergies as of 12/09/2019   No Known Allergies     Medication List    TAKE these medications   atorvastatin 10 MG tablet Commonly known as: LIPITOR Take 10 mg by mouth at bedtime.   b complex vitamins capsule Take 1 capsule by mouth daily.   ferrous sulfate 325 (65 FE) MG tablet Take 325 mg by mouth daily with breakfast.   ibuprofen 800 MG tablet Commonly known as: ADVIL Take 1 tablet (800 mg total) by mouth every 8 (eight) hours as needed.   loperamide 2 MG tablet Commonly known as: Imodium A-D Take 1 tablet (2 mg total) by mouth as needed for diarrhea or loose stools.   Metamucil Smooth Texture 58.6 % powder Generic drug: psyllium Take 1 packet by mouth daily.   omeprazole 20 MG capsule Commonly known as: PRILOSEC Take 20 mg by mouth every morning.   oxyCODONE 5 MG immediate release tablet Commonly known as: Oxy IR/ROXICODONE Take 1 tablet (5 mg total) by mouth every 6 (six) hours as needed for severe pain or breakthrough pain.   sitaGLIPtin 50 MG tablet Commonly known as: JANUVIA Take 50 mg by  mouth daily.        Follow-up Information    Piscoya, Jacqulyn Bath, MD. Schedule an appointment as soon as possible for a visit in 1 week(s).   Specialty: General Surgery Why: 1 week follow up, has staples Contact information: [address] [place] [address] [phone]            Time spent on discharge management including discussion of hospital course, clinical condition, outpatient instructions, prescriptions, and follow up with the patient and members of the medical team: >30 minutes  -- Edison Simon , PA-C [facility]  12/09/2019, 10:57 AM [phone] M-F: 7am - 4pm

## 2019-12-09 NOTE — Discharge Instructions (Signed)
You should take metamucil once daily and imodium 2 mg prn up to 3 times a day to help firm up stools

## 2019-12-09 NOTE — TOC Initial Note (Signed)
Transition of Care [facility]) - Initial/Assessment Note    Patient Details  Name: Ketan Renz MRN: [id number] Date of Birth: 19-Jan-1966  [facility]) CM/SW Contact:    Chapman Fitch, RN Phone Number: 12/09/2019, 1:10 PM  Clinical Narrative:                 Patient to discharge home today All discharge [facility]. All medications are over the counter, except for pain medications.  RNCM unable to provide goodrx coupons for [facility].  Patient confirms he will be able to purchase medications at discharge   Expected Discharge Plan: Home/Self Care Barriers to Discharge: No Barriers Identified   Patient Goals and CMS Choice        Expected Discharge Plan and Services Expected Discharge Plan: Home/Self Care   Discharge Planning Services: CM Consult   Living arrangements for the past 2 months: Mobile Home Expected Discharge Date: 12/09/19                                    Prior Living Arrangements/Services Living arrangements for the past 2 months: Mobile Home Lives with:: Roommate Patient language and need for interpreter reviewed:: Yes Do you feel safe going back to the place where you live?: Yes      Need for Family Participation in Patient Care: No (Comment) Care giver support system in place?: No (comment)   Criminal Activity/Legal Involvement Pertinent to Current Situation/Hospitalization: No - Comment as needed  Activities of Daily Living Home Assistive Devices/Equipment: None ADL Screening (condition at time of admission) Patient's cognitive ability adequate to safely complete daily activities?: Yes Is the patient deaf or have difficulty hearing?: No Does the patient have difficulty seeing, even when wearing glasses/contacts?: No Does the patient have difficulty concentrating, remembering, or making decisions?: No Patient able to express need for assistance with ADLs?:  Yes Does the patient have difficulty dressing or bathing?: No Independently performs ADLs?: Yes (appropriate for developmental age) Does the patient have difficulty walking or climbing stairs?: No Weakness of Legs: None Weakness of Arms/Hands: None  Permission Sought/Granted                  Emotional Assessment Appearance:: Appears stated age Attitude/Demeanor/Rapport: Gracious Affect (typically observed): Accepting Orientation: : Oriented to Self, Oriented to Place, Oriented to  Time, Oriented to Situation   Psych Involvement: No (comment)  Admission diagnosis:  Ileostomy in place [facility]) [Z93.2] Patient Active Problem List   Diagnosis Date Noted  . Abnormal EKG 12/04/2019  . Ileocolic anastomotic leak   . Ileostomy in place [facility]) 11/29/2019  . Colostomy in place [facility])   . Preop examination   . Diverticulosis of large intestine without diverticulitis   . Colonic edema   . Diverticulitis of sigmoid colon 07/07/2018  . Large bowel obstruction (HCC) 07/07/2018  . Cecum perforation 07/07/2018  . Pneumoperitoneum    PCP:  Shane Crutch, PA Pharmacy:   [facility] [address] [place], [place] - [address] [place] [address] Phone: [phone] Fax: [phone]     Social Determinants of Health (SDOH) Interventions    Readmission Risk Interventions No flowsheet data found.

## 2019-12-16 ENCOUNTER — Encounter: Payer: Self-pay | Admitting: Surgery

## 2019-12-16 ENCOUNTER — Ambulatory Visit (INDEPENDENT_AMBULATORY_CARE_PROVIDER_SITE_OTHER): Payer: Self-pay | Admitting: Surgery

## 2019-12-16 ENCOUNTER — Other Ambulatory Visit: Payer: Self-pay

## 2019-12-16 VITALS — BP 108/73 | HR 98 | Temp 97.2°F | Resp 12 | Ht 64.0 in | Wt 153.6 lb

## 2019-12-16 DIAGNOSIS — Z09 Encounter for follow-up examination after completed treatment for conditions other than malignant neoplasm: Secondary | ICD-10-CM

## 2019-12-16 DIAGNOSIS — Z932 Ileostomy status: Secondary | ICD-10-CM

## 2019-12-16 DIAGNOSIS — Z933 Colostomy status: Secondary | ICD-10-CM

## 2019-12-16 NOTE — Patient Instructions (Addendum)
Take Metamucil or Benifiber daily to help with the loose stools.  If they do not work you may try taking Imodium, 2 mg as needed.   Change your dressings daily.  Follow up here in 2 weeks.   Call us if your would starts to open.

## 2019-12-16 NOTE — Progress Notes (Signed)
12/16/2019  HPI: Delford Wingert is a 54 y.o. male s/p open ileostomy takedown and colostomy takedown on 4/27, complicated by ileocolonic anastomotic leak, requiring takeback to the OR for redo of the anastomosis.  He was discharged on 5/7 and presents today for follow up.  He reports that he's been doing well, with improving pain, tolerating diet, and having bowel movements.  He's still going a few times per day, about 5.  Denies any issues with the incisions.  Vital signs: BP 108/73   Pulse 98   Temp (!) 97.2 F (36.2 C)   Resp 12   Ht 5\' 4"  (1.626 m)   Wt 153 lb 9.6 oz (69.7 kg)   SpO2 94%   BMI 26.37 kg/m    Physical Exam: Constitutional:  No acute distress Abdomen:  Soft, non-distended, appropriately sore to palpation.  Incisions are clean, dry, intact with staples in place and penrose drains in both ostomy sites.  Staples were removed from all incisions and the drains were removed as well.  Steri strips were applied throughout.  The RLQ ostomy site looks like the skin is trying to separate, so placed additional reinforcement of steri strips to help support better.  Assessment/Plan: This is a 54 y.o. male s/p ileostomy and colostomy takedown, with takeback to OR for leak.  --Patient is doing well so far, healing appropriately. --Discussed with him that he should start taking Metamucil or Benefiber daily to help with the bowel movements, and if that does not work to slow things down, he can add Imodium 2 mg tablets.  The goal is to avoid diarrhea, and have fewer bowel movements per day. --Staples and drains removed, steri strips applied.  More applied on the RLQ incision as the skin seemed to try to separate.  Discussed with him that if the wound opens, he should let 40 know right away. --Follow up in two weeks to check on his progress. --Continue no heavy lifting or pushing for two more weeks.     [place], MD [facility]

## 2019-12-30 ENCOUNTER — Ambulatory Visit (INDEPENDENT_AMBULATORY_CARE_PROVIDER_SITE_OTHER): Payer: Self-pay | Admitting: Surgery

## 2019-12-30 ENCOUNTER — Other Ambulatory Visit: Payer: Self-pay

## 2019-12-30 ENCOUNTER — Encounter: Payer: Self-pay | Admitting: Surgery

## 2019-12-30 VITALS — BP 109/76 | HR 97 | Temp 97.7°F | Ht 62.0 in | Wt 153.4 lb

## 2019-12-30 DIAGNOSIS — Z933 Colostomy status: Secondary | ICD-10-CM

## 2019-12-30 DIAGNOSIS — Z932 Ileostomy status: Secondary | ICD-10-CM

## 2019-12-30 DIAGNOSIS — Z09 Encounter for follow-up examination after completed treatment for conditions other than malignant neoplasm: Secondary | ICD-10-CM

## 2019-12-30 NOTE — Progress Notes (Signed)
12/30/2019  HPI: Alexander Carpenter is a 54 y.o. male s/p ileostomy and colostomy takedown on 11/29/2019, complicated by ileocolonic anastomotic leak status post ex lap resection and reanastomosis.  He was last seen on 5/14 during which time his staples were removed as well as a Penrose drains at the ostomy site.  He was also started on Metamucil and Imodium.  Patient presents today for follow-up.  Reports that he has been doing well with no complications.  Sometimes he will have worse diarrhea he has been taking Imodium as needed.  Denies any worsening pain or drainage from the incisions.  Vital signs: BP 109/76   Pulse 97   Temp 97.7 F (36.5 C) (Temporal)   Ht 5\' 2"  (1.575 m)   Wt 153 lb 6.4 oz (69.6 kg)   SpO2 96%   BMI 28.06 kg/m    Physical Exam: Constitutional: No acute distress Abdomen: Soft, nondistended, nontender to palpation.  Midline incision is healing well without any evidence of hernia, infection, or wound issues.  Left lower quadrant ostomy site is healing well without any evidence of infection, hernia, or wound issues.  Right lower quadrant ostomy site is healing well but there is a small scab that was removed revealing area of superficial wound measuring about 5 mm x 3 cm.  This was dressed with dry gauze dressing and tape.  Otherwise no evidence of hernia or infection.  Assessment/Plan: This is a 54 y.o. male s/p ileostomy and colostomy takedown.  -Discussed with the patient that he is healing very well and there is no evidence of any complications at this point.  The scab on the right lower quadrant ostomy site reveals only very superficial wound which will heal relatively quickly and only needs dry gauze dressing daily. -Patient still has 2 more weeks of activity restrictions. -Patient will follow-up in approximately 2 weeks for another check of his right lower quadrant wound and at that point he should be able to give him a note to return back to work with no  activity restrictions.   40, MD  [facility]

## 2019-12-30 NOTE — Patient Instructions (Addendum)
Dr.Piscoya advised patient to try over the counter Imodium 2 mg to help with diarrhea.   Dr.Piscoya advised patient to change gauze and apply tape to the right lower area daily.    Cuidado de las heridas, en adultos Wound Care, Adult El cuidado correcto de las heridas puede ayudar a Psychologist, sport and exercise y a Radio producer las infecciones y formacin de cicatrices. Adems, puede ayudar a que la cicatrizacin sea ms rpida. Cmo cuidar la herida Cuidados de la herida      Siga las instrucciones del mdico acerca del cuidado de la herida. Asegrese de hacer lo siguiente: ? Lvese las manos con agua y jabn antes de cambiar la venda (vendaje). Use desinfectante para manos si no dispone de [place] y [place]. ? Cambie el vendaje como se lo haya indicado el mdico. ? No retire los puntos (suturas), la goma para cerrar la piel o las tiras [place]. Es posible que estos cierres cutneos deban quedar puestos en la piel durante 2semanas o ms tiempo. Si los bordes de las tiras [address] empiezan a despegarse y Scientific laboratory technician, puede recortar los que estn sueltos. No retire las tiras [employer] por completo a menos que el mdico se lo indique.  Controle la zona de la herida todos los [address]  [address] para detectar signos de infeccin. Est atento a los siguientes signos: ? Enrojecimiento, hinchazn o dolor. ? Lquido o sangre. ? Calor. ? Pus o mal olor.  Pregntele al mdico si debe limpiar la herida con agua y [place]. Hacer esto puede incluir lo siguiente: ? Usar una toalla limpia para secar la herida dando palmaditas despus de limpiarla. No se frote ni restriegue la herida. ? Aplicar una crema o un ungento. Hgalo nicamente como se lo haya indicado el mdico. ? Cubrir la incisin con un vendaje limpio.  Pregntele al mdico cundo puede dejar la herida al descubierto.  Mantenga el vendaje seco hasta que el mdico le diga que se lo puede quitar. No tome baos de inmersin, nade, use el jacuzzi ni haga ninguna  actividad en la que sumerja la herida en agua hasta que el mdico lo autorice. Pregntele al mdico si puede ducharse. Delle Reining solo le permitan darse baos de [place]. Medicamentos   Si le recetaron un antibitico, una crema o un ungento con antibitico, tmelo o aplqueselo como se lo haya indicado el mdico. No deje de tomar o de usar el antibitico, aunque la afeccin mejore.  Tome los medicamentos de venta libre y los recetados solamente como se lo haya indicado el mdico. Si le recetaron un analgsico, tmelo al menos antes de Education officer, environmental el cuidado de la herida, Public librarian se lo haya indicado el mdico. Instrucciones generales  Reanude sus actividades normales segn lo indicado por el mdico. Pregntele al mdico qu actividades son seguras.  No se rasque ni se toque la herida.  No consuma ningn producto que contenga nicotina o tabaco, como cigarrillos y Administrator, Civil Service. El consumo de esos productos puede demorar la cicatrizacin de la herida. Si necesita ayuda para dejar de fumar, consulte al mdico.  Concurra a todas las visitas de seguimiento como se lo haya indicado el mdico. Esto es importante.  Siga una dieta que incluya protenas, vitaminas A y C y otros alimentos ricos en nutrientes que ayudarn a que la herida cicatrice. ? Los alimentos ricos en protenas incluyen carne, productos lcteos, frijoles y nueces, entre otras fuentes de protena. ? Los alimentos ricos en vitaminaA incluyen zanahorias y verduras de hoja verde oscuro. ? Los  alimentos ricos en vitaminaC incluyen ctricos, tomates y otras frutas y verduras. ? Los alimentos ricos en nutrientes tienen protenas, carbohidratos, grasas, vitaminas o minerales. Consuma una variedad de alimentos saludables, que incluya frutas, verduras y Psychologist, prison and probation services. Comunquese con un mdico si:  Le aplicaron la antitetnica y tiene hinchazn, dolor intenso, enrojecimiento o hemorragia en el sitio de la inyeccin.  El  dolor no se alivia con los [employer].  Tiene enrojecimiento, hinchazn o dolor alrededor de la herida.  Observa lquido o sangre que sale de la herida.  La herida se siente caliente al tacto.  Tiene pus o percibe mal olor que emana de la herida.  Tiene fiebre o siente escalofros.  Siente nuseas o vomita.  Tiene mareos. Solicite ayuda de inmediato si:  Tiene una lnea roja que sale de la herida.  Los bordes de la herida se abren y se separan.  La herida sangra y el sangrado no se detiene con una presin [place].  Tiene una erupcin cutnea.  Se desmaya.  Tiene dificultad para respirar. Resumen  Siempre lvese las manos con agua y jabn antes de cambiar la venda (vendaje).  Para ayudar con la cicatrizacin, coma alimentos ricos en protenas, vitaminas A y C y dems nutrientes.  Controle la herida [employer] para detectar signos de infeccin. Comunquese con el mdico si sospecha que la herida est infectada. Esta informacin no tiene Marine scientist el consejo del mdico. Asegrese de hacerle al mdico cualquier pregunta que tenga. Document Revised: 11/02/2017 Document Reviewed: 02/05/2016 Elsevier Patient Education  [place].

## 2020-01-06 ENCOUNTER — Encounter: Payer: Self-pay | Admitting: *Deleted

## 2020-01-11 ENCOUNTER — Ambulatory Visit (INDEPENDENT_AMBULATORY_CARE_PROVIDER_SITE_OTHER): Payer: Self-pay | Admitting: Physician Assistant

## 2020-01-11 ENCOUNTER — Encounter: Payer: Self-pay | Admitting: Physician Assistant

## 2020-01-11 ENCOUNTER — Encounter: Payer: Self-pay | Admitting: Emergency Medicine

## 2020-01-11 ENCOUNTER — Other Ambulatory Visit: Payer: Self-pay

## 2020-01-11 VITALS — BP 95/62 | HR 85 | Temp 95.9°F | Resp 12 | Wt 156.8 lb

## 2020-01-11 DIAGNOSIS — Z09 Encounter for follow-up examination after completed treatment for conditions other than malignant neoplasm: Secondary | ICD-10-CM

## 2020-01-11 NOTE — Patient Instructions (Signed)
follow up as needed call the office if you have any questions or concerns  haga un seguimiento segn sea necesario, llame a la oficina si tiene alguna pregunta o inquietud

## 2020-01-11 NOTE — Progress Notes (Signed)
[facility] POST-OP OFFICE VISIT  01/11/2020  HPI: Alexander Carpenter is a 54 y.o. male s/p ileostomy and colostomy takedown on 11/29/2019, complicated by ileocolonic anastomotic leak status post ex lap resection and reanastomosis  Overall doing well Minimal pain, no longer needing pain medications No fever, chills, nausea, or emesis Tolerating PO Normal bowel function Mobilizing without issues   Vital signs: BP 95/62   Pulse 85   Temp (!) 95.9 F (35.5 C)   Resp 12   Wt 156 lb 12.8 oz (71.1 kg)   SpO2 97%   BMI 28.68 kg/m    Physical Exam: Constitutional: Well appearing male, NAD Abdomen: Soft, Non-tender, non-distended, no rebound/guarding Skin: Laparotomy and laparoscopic incisions are are well healed, no drainage  Assessment/Plan: This is a 54 y.o. male s/p ileostomy and colostomy takedown on 11/29/2019, complicated by ileocolonic anastomotic leak status post ex lap resection and reanastomosis   - No new issues  - Wounds healed  - return to work note provided  - rtc prn  -- Lynden Oxford, PA-C [facility] 01/11/2020, 10:07 AM [phone] M-F: 7am - 4pm

## 2020-08-24 IMAGING — CT CT ABD-PELV W/ CM
2 of 5 series · 15 of 46 positions shown, 17 images · IV contrast (omnipaque)
Comparison: CT 06/24/2018

CLINICAL DATA: Nausea and vomiting

EXAM:
CT ABDOMEN AND PELVIS WITH CONTRAST
TECHNIQUE: Multidetector CT imaging of the abdomen and pelvis was performed
using the standard protocol following bolus administration of
intravenous contrast.
CONTRAST:  100mL OMNIPAQUE IOHEXOL 300 MG/ML  SOLN

[Series 2: routine abd/pel with · axial · 0.77mm/px · z∈[-76,+334]mm · 12 of 94 slices shown, 14 images]
[im 6/94  soft-tissue]
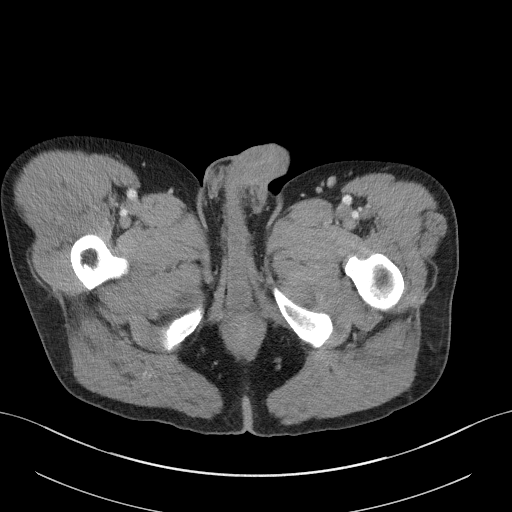
[im 6/94  bone]
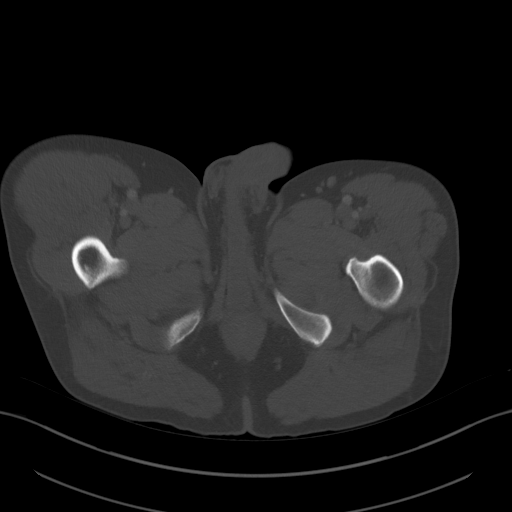
[im 16/94  soft-tissue]
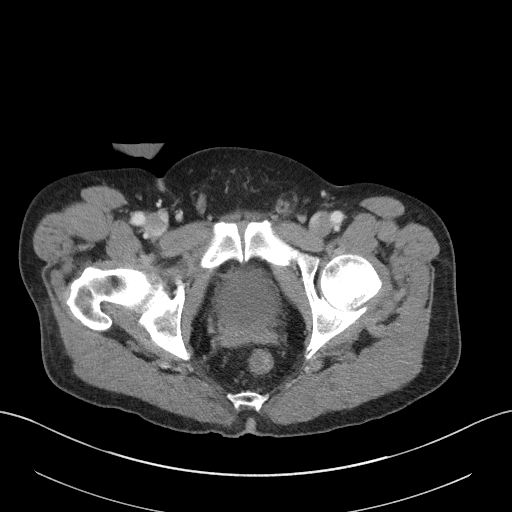
[im 21/94  soft-tissue]
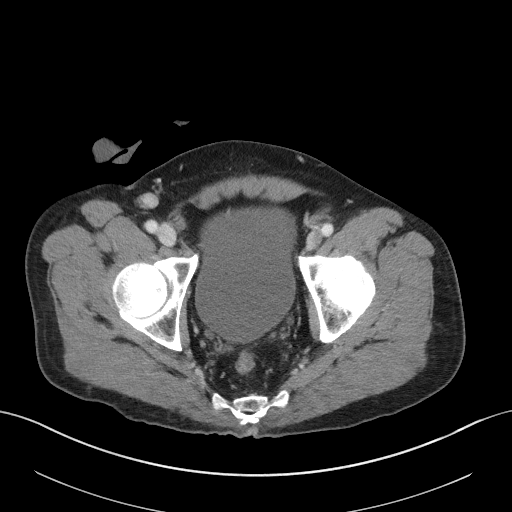
[im 26/94  soft-tissue]
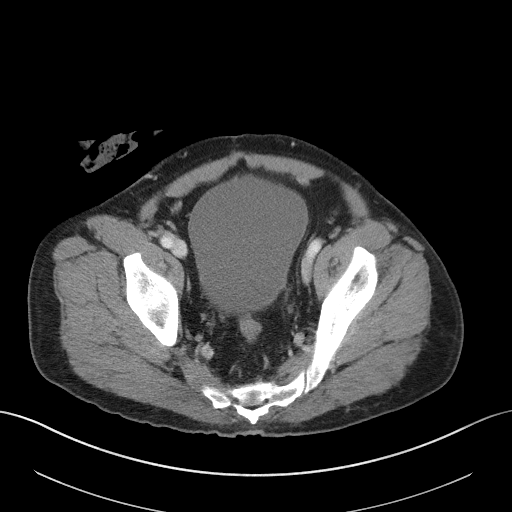
[im 37/94  soft-tissue]
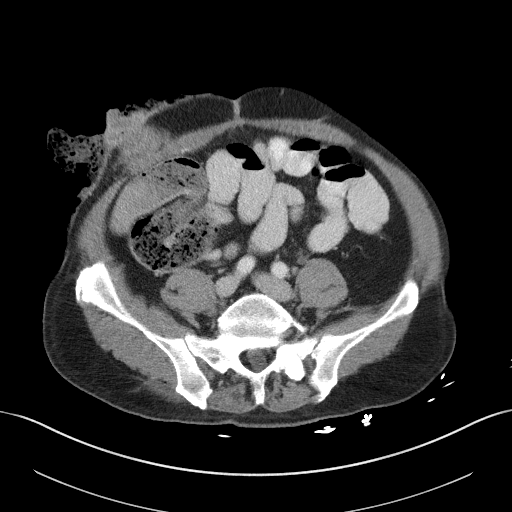
[im 42/94  soft-tissue]
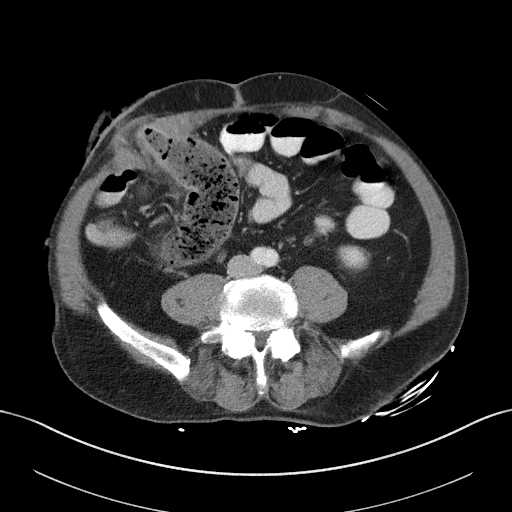
[im 52/94  soft-tissue]
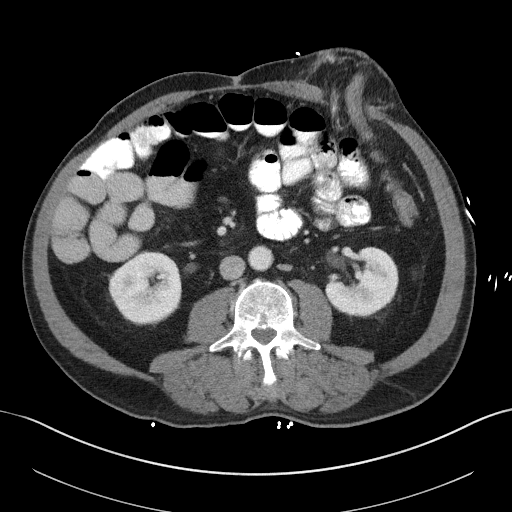
[im 57/94  soft-tissue]
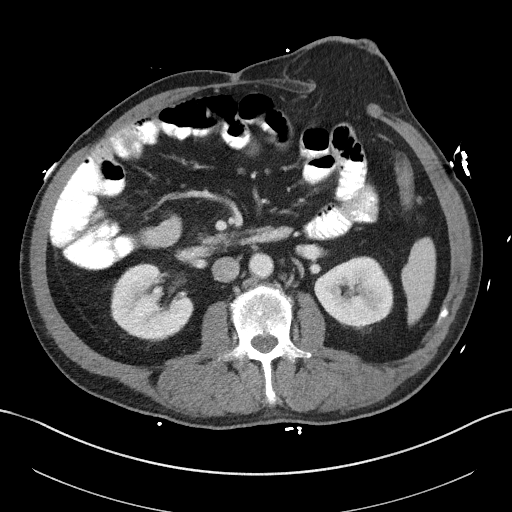
[im 68/94  soft-tissue]
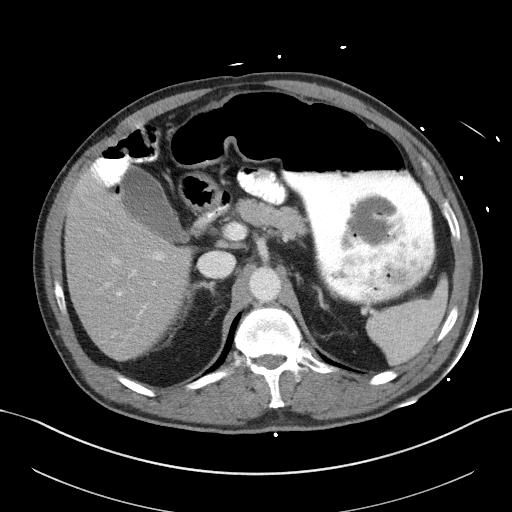
[im 68/94  bone]
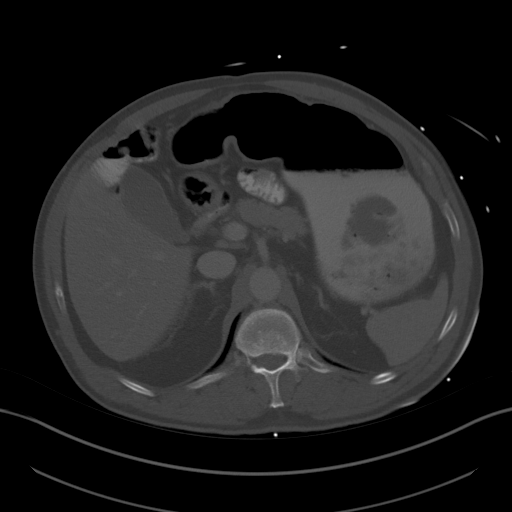
[im 73/94  soft-tissue]
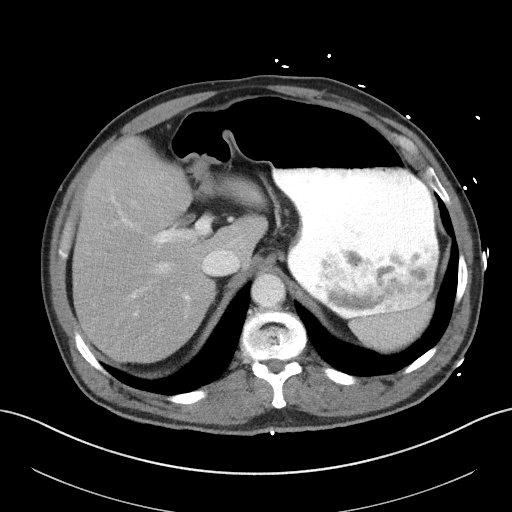
[im 78/94  soft-tissue]
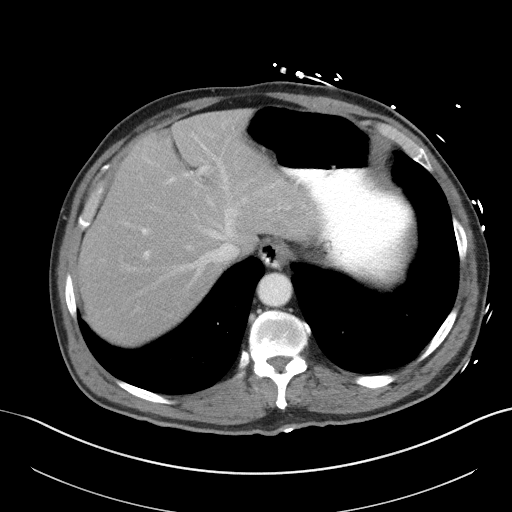
[im 88/94  soft-tissue]
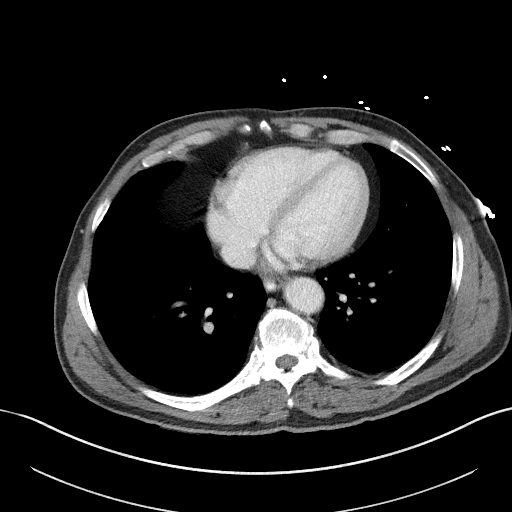

[Series 5: coronal st · coronal · 0.74mm/px · 3 of 98 slices shown]
[im 33/98  soft-tissue]
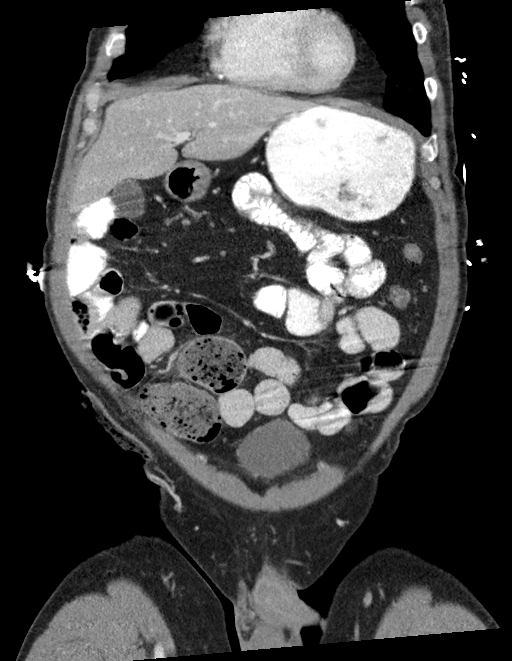
[im 44/98  soft-tissue]
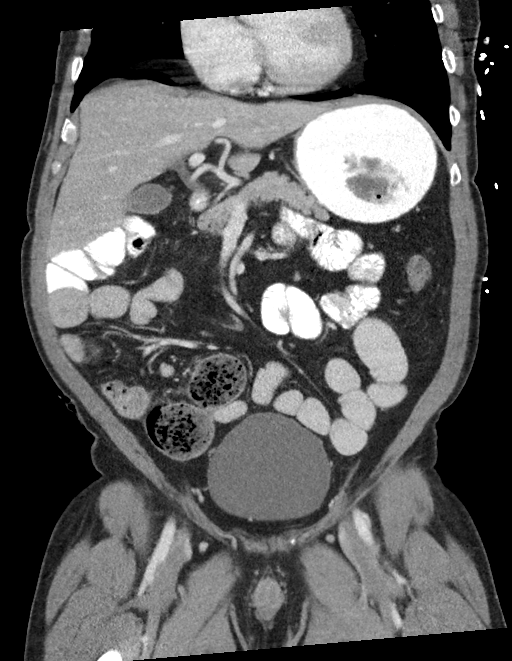
[im 54/98  soft-tissue]
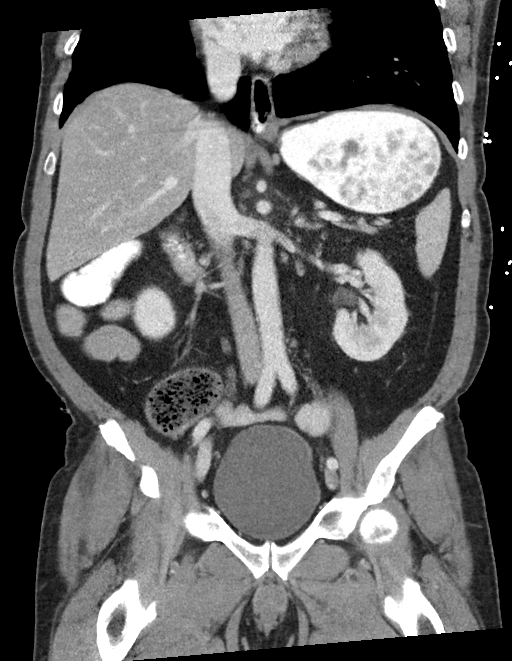

[15 of 46 positions shown; findings below may reference images not displayed]

FINDINGS: Lower chest: Lung bases demonstrate no acute consolidation or
effusion. The heart size is normal.

Hepatobiliary: No focal liver abnormality is seen. No gallstones,
gallbladder wall thickening, or biliary dilatation.

Pancreas: Unremarkable. No pancreatic ductal dilatation or
surrounding inflammatory changes.

Spleen: Normal in size without focal abnormality.

Adrenals/Urinary Tract: Adrenal glands are normal. Kidneys show no
hydronephrosis. Prominent ureters bilaterally but no ureteral stone.
Distended urinary bladder.

Stomach/Bowel: The stomach is moderately enlarged. Interval partial
colectomy changes. The right colon has been resected. Blind ending
proximal transverse colon stump ends in a left abdominal colostomy.
Large fat containing parastomal hernia. Resection of previously
noted sigmoid colon mass and stricture. Blind ending rectosigmoid
stump is unremarkable.

Right lower quadrant ileostomy. There is edema and thickening at the
right lower quadrant stoma. The small bowel upstream from the ostomy
is abnormally dilated, measuring up to 3.8 cm and has a fecalized
pattern, consistent with obstruction. Remainder of the small bowel
is nonenlarged.

Vascular/Lymphatic: Nonaneurysmal aorta. No significantly enlarged
lymph nodes.

Reproductive: Prostate is unremarkable.

Other: Negative for free air or free fluid. Small fat in the
umbilical region

Musculoskeletal: Degenerative changes of the spine. Trace
retrolisthesis L3 on L4. No acute or suspicious osseous abnormality
IMPRESSION: 1. Interval postsurgical changes of the colon with resection of the
right colon and the previously noted sigmoid colon mass and
stricture. Creation of right lower quadrant ileostomy, left
abdominal colostomy, and Yair Valeriano. Abnormal thickening and
edema at the orifice of the right lower quadrant ostomy with
dilatation and fecalization of the small bowel upstream to the
ostomy, consistent with obstruction, either from stricture at the
ostomy or edema from inflammation.
2. Large fat containing parastomal hernia about the left abdominal
colostomy.
